# Patient Record
Sex: Male | Born: 1955 | Race: Black or African American | Hispanic: No | Marital: Single | Smoking: Never smoker
Health system: Southern US, Community
[De-identification: ages and names within clinical notes are randomized; demographics above are authoritative.]

## PROBLEM LIST (undated history)

## (undated) DIAGNOSIS — K219 Gastro-esophageal reflux disease without esophagitis: Secondary | ICD-10-CM

## (undated) DIAGNOSIS — I10 Essential (primary) hypertension: Secondary | ICD-10-CM

## (undated) DIAGNOSIS — Z923 Personal history of irradiation: Secondary | ICD-10-CM

## (undated) DIAGNOSIS — C801 Malignant (primary) neoplasm, unspecified: Secondary | ICD-10-CM

## (undated) DIAGNOSIS — E785 Hyperlipidemia, unspecified: Secondary | ICD-10-CM

## (undated) DIAGNOSIS — T7840XA Allergy, unspecified, initial encounter: Secondary | ICD-10-CM

## (undated) HISTORY — DX: Allergy, unspecified, initial encounter: T78.40XA

## (undated) HISTORY — PX: WISDOM TOOTH EXTRACTION: SHX21

## (undated) HISTORY — PX: OTHER SURGICAL HISTORY: SHX169

## (undated) HISTORY — DX: Gastro-esophageal reflux disease without esophagitis: K21.9

## (undated) HISTORY — DX: Essential (primary) hypertension: I10

## (undated) HISTORY — PX: APPENDECTOMY: SHX54

## (undated) HISTORY — PX: LEG SURGERY: SHX1003

## (undated) HISTORY — DX: Hyperlipidemia, unspecified: E78.5

---

## 2016-11-21 ENCOUNTER — Ambulatory Visit (INDEPENDENT_AMBULATORY_CARE_PROVIDER_SITE_OTHER): Payer: BLUE CROSS/BLUE SHIELD | Admitting: Family Medicine

## 2016-11-21 ENCOUNTER — Encounter: Payer: Self-pay | Admitting: Family Medicine

## 2016-11-21 VITALS — BP 138/76 | HR 62 | Temp 98.3°F | Ht 67.0 in | Wt 215.8 lb

## 2016-11-21 DIAGNOSIS — Z7689 Persons encountering health services in other specified circumstances: Secondary | ICD-10-CM

## 2016-11-21 DIAGNOSIS — J301 Allergic rhinitis due to pollen: Secondary | ICD-10-CM | POA: Diagnosis not present

## 2016-11-21 DIAGNOSIS — J309 Allergic rhinitis, unspecified: Secondary | ICD-10-CM | POA: Insufficient documentation

## 2016-11-21 DIAGNOSIS — I10 Essential (primary) hypertension: Secondary | ICD-10-CM

## 2016-11-21 MED ORDER — ATENOLOL 50 MG PO TABS: 50.0000 mg | ORAL_TABLET | Freq: Every day | ORAL | 1 refills | Status: DC

## 2016-11-21 MED ORDER — FLUTICASONE PROPIONATE 50 MCG/ACT NA SUSP: 2.0000 | Freq: Every day | NASAL | 6 refills | Status: DC

## 2016-11-21 MED ORDER — LISINOPRIL-HYDROCHLOROTHIAZIDE 20-25 MG PO TABS: 1.0000 | ORAL_TABLET | Freq: Every day | ORAL | 1 refills | Status: DC

## 2016-11-21 NOTE — Patient Instructions (Addendum)
It was a pleasure to see you today! Please follow up for a physical and lab work within the next 3 months or sooner if needed.    DASH Eating Plan DASH stands for "Dietary Approaches to Stop Hypertension." The DASH eating plan is a healthy eating plan that has been shown to reduce high blood pressure (hypertension). It may also reduce your risk for type 2 diabetes, heart disease, and stroke. The DASH eating plan may also help with weight loss. What are tips for following this plan? General guidelines   Avoid eating more than 2,300 mg (milligrams) of salt (sodium) a day. If you have hypertension, you may need to reduce your sodium intake to 1,500 mg a day.  Limit alcohol intake to no more than 1 drink a day for nonpregnant women and 2 drinks a day for men. One drink equals 12 oz of beer, 5 oz of wine, or 1 oz of hard liquor.  Work with your health care provider to maintain a healthy body weight or to lose weight. Ask what an ideal weight is for you.  Get at least 30 minutes of exercise that causes your heart to beat faster (aerobic exercise) most days of the week. Activities may include walking, swimming, or biking.  Work with your health care provider or diet and nutrition specialist (dietitian) to adjust your eating plan to your individual calorie needs. Reading food labels   Check food labels for the amount of sodium per serving. Choose foods with less than 5 percent of the Daily Value of sodium. Generally, foods with less than 300 mg of sodium per serving fit into this eating plan.  To find whole grains, look for the word "whole" as the first word in the ingredient list. Shopping   Buy products labeled as "low-sodium" or "no salt added."  Buy fresh foods. Avoid canned foods and premade or frozen meals. Cooking   Avoid adding salt when cooking. Use salt-free seasonings or herbs instead of table salt or sea salt. Check with your health care provider or pharmacist before using salt  substitutes.  Do not fry foods. Cook foods using healthy methods such as baking, boiling, grilling, and broiling instead.  Cook with heart-healthy oils, such as olive, canola, soybean, or sunflower oil. Meal planning    Eat a balanced diet that includes:  5 or more servings of fruits and vegetables each day. At each meal, try to fill half of your plate with fruits and vegetables.  Up to 6-8 servings of whole grains each day.  Less than 6 oz of lean meat, poultry, or fish each day. A 3-oz serving of meat is about the same size as a deck of cards. One egg equals 1 oz.  2 servings of low-fat dairy each day.  A serving of nuts, seeds, or beans 5 times each week.  Heart-healthy fats. Healthy fats called Omega-3 fatty acids are found in foods such as flaxseeds and coldwater fish, like sardines, salmon, and mackerel.  Limit how much you eat of the following:  Canned or prepackaged foods.  Food that is high in trans fat, such as fried foods.  Food that is high in saturated fat, such as fatty meat.  Sweets, desserts, sugary drinks, and other foods with added sugar.  Full-fat dairy products.  Do not salt foods before eating.  Try to eat at least 2 vegetarian meals each week.  Eat more home-cooked food and less restaurant, buffet, and fast food.  When eating at a  restaurant, ask that your food be prepared with less salt or no salt, if possible. What foods are recommended? The items listed may not be a complete list. Talk with your dietitian about what dietary choices are best for you. Grains  Whole-grain or whole-wheat bread. Whole-grain or whole-wheat pasta. Brown rice. [place] Morrow. Bulgur. Whole-grain and low-sodium cereals. Pita bread. Low-fat, low-sodium crackers. Whole-wheat flour tortillas. Vegetables  Fresh or frozen vegetables (raw, steamed, roasted, or grilled). Low-sodium or reduced-sodium tomato and vegetable juice. Low-sodium or reduced-sodium tomato sauce and  tomato paste. Low-sodium or reduced-sodium canned vegetables. Fruits  All fresh, dried, or frozen fruit. Canned fruit in natural juice (without added sugar). Meat and other protein foods  Skinless chicken or [place]. Ground chicken or [place]. Pork with fat trimmed off. Fish and seafood. Egg whites. Dried beans, peas, or lentils. Unsalted nuts, nut butters, and seeds. Unsalted canned beans. Lean cuts of beef with fat trimmed off. Low-sodium, lean deli meat. Dairy  Low-fat (1%) or fat-free (skim) milk. Fat-free, low-fat, or reduced-fat cheeses. Nonfat, low-sodium ricotta or cottage cheese. Low-fat or nonfat yogurt. Low-fat, low-sodium cheese. Fats and oils  Soft margarine without trans fats. Vegetable oil. Low-fat, reduced-fat, or light mayonnaise and salad dressings (reduced-sodium). Canola, safflower, olive, soybean, and sunflower oils. Avocado. Seasoning and other foods  Herbs. Spices. Seasoning mixes without salt. Unsalted popcorn and pretzels. Fat-free sweets. What foods are not recommended? The items listed may not be a complete list. Talk with your dietitian about what dietary choices are best for you. Grains  Baked goods made with fat, such as croissants, muffins, or some breads. Dry pasta or rice meal packs. Vegetables  Creamed or fried vegetables. Vegetables in a cheese sauce. Regular canned vegetables (not low-sodium or reduced-sodium). Regular canned tomato sauce and paste (not low-sodium or reduced-sodium). Regular tomato and vegetable juice (not low-sodium or reduced-sodium). Angie Fava. Olives. Fruits  Canned fruit in a light or heavy syrup. Fried fruit. Fruit in cream or butter sauce. Meat and other protein foods  Fatty cuts of meat. Ribs. Fried meat. Berniece Salines. Sausage. Bologna and other processed lunch meats. Salami. Fatback. Hotdogs. Bratwurst. Salted nuts and seeds. Canned beans with added salt. Canned or smoked fish. Whole eggs or egg yolks. Chicken or [place] with skin. Dairy  Whole  or 2% milk, cream, and half-and-half. Whole or full-fat cream cheese. Whole-fat or sweetened yogurt. Full-fat cheese. Nondairy creamers. Whipped toppings. Processed cheese and cheese spreads. Fats and oils  Butter. Stick margarine. Lard. Shortening. Ghee. Bacon fat. Tropical oils, such as coconut, palm kernel, or palm oil. Seasoning and other foods  Salted popcorn and pretzels. Onion salt, garlic salt, seasoned salt, table salt, and sea salt. Worcestershire sauce. Tartar sauce. Barbecue sauce. Teriyaki sauce. Soy sauce, including reduced-sodium. Steak sauce. Canned and packaged gravies. Fish sauce. Oyster sauce. Cocktail sauce. Horseradish that you find on the shelf. Ketchup. Mustard. Meat flavorings and tenderizers. Bouillon cubes. Hot sauce and Tabasco sauce. Premade or packaged marinades. Premade or packaged taco seasonings. Relishes. Regular salad dressings. Where to find more information:  National Heart, Lung, and [place]: https://wilson-eaton.com/  American Heart Association: www.heart.org Summary  The DASH eating plan is a healthy eating plan that has been shown to reduce high blood pressure (hypertension). It may also reduce your risk for type 2 diabetes, heart disease, and stroke.  With the DASH eating plan, you should limit salt (sodium) intake to 2,300 mg a day. If you have hypertension, you may need to reduce your sodium intake to 1,500  mg a day.  When on the DASH eating plan, aim to eat more fresh fruits and vegetables, whole grains, lean proteins, low-fat dairy, and heart-healthy fats.  Work with your health care provider or diet and nutrition specialist (dietitian) to adjust your eating plan to your individual calorie needs. This information is not intended to replace advice given to you by your health care provider. Make sure you discuss any questions you have with your health care provider. Document Released: 06/13/2011 Document Revised: 06/17/2016 Document Reviewed:  06/17/2016 Elsevier Interactive Patient Education  2017 [place] NOW OFFER   [facility] FAST TRACK!!!  SAME DAY Appointments for ACUTE CARE  Such as: Sprains, Injuries, cuts, abrasions, rashes, muscle pain, joint pain, back pain Colds, flu, sore throats, headache, allergies, cough, fever  Ear pain, sinus and eye infections Abdominal pain, nausea, vomiting, diarrhea, upset stomach Animal/insect bites  3 Easy Ways to Schedule: Walk-In Scheduling Call in scheduling Mychart Sign-up: [email or web address]

## 2016-11-21 NOTE — Progress Notes (Signed)
Patient ID: Matthew Hollyfield., male   DOB: 1956-01-20, 61 y.o.   MRN: [id number]  Patient presents to clinic today to establish care. He is a nonsmoker who reports feeling well overall.  Chronic Issues: HTN:  He monitors BP approximately every 3 days and reports systolic averages in the 427C and diastolic averages in the 62B. He denies chest pain, palpitations, SOB, numbness, tingling, weakness, headaches, or edema. He is adherent to his medication and denies adverse effects from medications. He follows a modified heart healthy diet and he exercises with walking 3 to 4 days a week approximately 30 minutes. He is taking atenolol and lisinopril/HCTZ. He does report adding salt to his diet "at times"   Allergic rhinitis:  Post nasal drip, rhinitis and nonproductive cough that are present with seasonal changes. He denies itchy/watery eyes, sinus pressure/pain, nasal congestion, sore throat, fever, chills, sweats, or ear pain Treatment at home with Zyrtec has provided moderate benefit   Health Maintenance: Dental --Needs to go; goes yearly [facility] --Needs to go; he plans to have this done Immunizations --Influenza vaccine 05/2016, Tdap needed Colonoscopy --Needs to have this done.     Past Medical History:  Diagnosis Date  . Hypertension     History reviewed. No pertinent surgical history.  No current outpatient prescriptions on file prior to visit.   No current facility-administered medications on file prior to visit.     Allergies  Allergen Reactions  . Pollen Extract     Family History  Problem Relation Age of Onset  . Arthritis Mother   . Hypertension Mother   . Diabetes Father   . Diabetes Brother     Social History   Social History  . Marital status: Single    Spouse name: N/A  . Number of children: N/A  . Years of education: N/A   Occupational History  . Not on file.   Social History Main Topics  . Smoking status: Never Smoker  . Smokeless tobacco: Never  Used  . Alcohol use Yes     Comment: Occassional  . Drug use: No  . Sexual activity: Not on file   Other Topics Concern  . Not on file   Social History Narrative  . No narrative on file    Review of Systems  Constitutional: Negative for chills and fever.  HENT: Negative for congestion, sinus pain and sore throat.        Post nasal drip  Respiratory: Positive for cough. Negative for sputum production, shortness of breath and wheezing.   Cardiovascular: Negative for chest pain and palpitations.  Gastrointestinal: Negative for abdominal pain, heartburn, melena, nausea and vomiting.  Genitourinary: Negative for dysuria, frequency and urgency.  Musculoskeletal: Negative for myalgias.  Skin: Negative for rash.  Neurological: Negative for dizziness, tingling and tremors.  Psychiatric/Behavioral:       Depressed or anxious mood    BP 138/76 (BP Location: Left Arm, Patient Position: Sitting, Cuff Size: Normal)   Pulse 62   Temp 98.3 F (36.8 C) (Oral)   Ht 5\' 7"  (1.702 m)   Wt 215 lb 12.8 oz (97.9 kg)   SpO2 98%   BMI 33.80 kg/m   Physical Exam  Assessment/Plan: 1. Hypertension, essential Controlled; provided refills of medications and reviewed DASH diet recommendations. - lisinopril-hydrochlorothiazide (PRINZIDE,ZESTORETIC) 20-25 MG tablet; Take 1 tablet by mouth daily.  Dispense: 100 tablet; Refill: 1 - atenolol (TENORMIN) 50 MG tablet; Take 1 tablet (50 mg total) by mouth daily.  Dispense: 100 tablet;  Refill: 1  2. Seasonal allergic rhinitis due to pollen Zyrtec with addition of flonase for symptoms. - fluticasone (FLONASE) 50 MCG/ACT nasal spray; Place 2 sprays into both nostrils daily.  Dispense: 16 g; Refill: 6  3. Encounter to establish care We reviewed the PMH, PSH, FH, SH, Meds and Allergies. -We provided refills for any medications we will prescribe as needed. -We addressed current concerns per orders and patient instructions. -We have asked for records for  pertinent exams, studies, vaccines and notes from previous providers. -We have advised patient to follow up per instructions below.   -Patient advised to return or notify a provider immediately if symptoms worsen or persist or new concerns arise.  Patient is overdue for a physical exam and lab work. He will schedule a physical within the next 3 months. He will consider health maintenance screenings such as colonoscopy at that time.  Delano Metz, FNP-C

## 2017-02-11 DIAGNOSIS — J329 Chronic sinusitis, unspecified: Secondary | ICD-10-CM | POA: Diagnosis not present

## 2017-02-11 DIAGNOSIS — R5383 Other fatigue: Secondary | ICD-10-CM | POA: Diagnosis not present

## 2017-02-11 DIAGNOSIS — R6883 Chills (without fever): Secondary | ICD-10-CM | POA: Diagnosis not present

## 2017-05-27 ENCOUNTER — Ambulatory Visit (INDEPENDENT_AMBULATORY_CARE_PROVIDER_SITE_OTHER): Payer: BLUE CROSS/BLUE SHIELD | Admitting: Adult Health

## 2017-05-27 ENCOUNTER — Encounter: Payer: Self-pay | Admitting: Adult Health

## 2017-05-27 VITALS — BP 136/76 | Temp 97.8°F | Ht 68.0 in | Wt 217.0 lb

## 2017-05-27 DIAGNOSIS — Z1211 Encounter for screening for malignant neoplasm of colon: Secondary | ICD-10-CM | POA: Diagnosis not present

## 2017-05-27 DIAGNOSIS — K2100 Gastro-esophageal reflux disease with esophagitis, without bleeding: Secondary | ICD-10-CM | POA: Insufficient documentation

## 2017-05-27 DIAGNOSIS — Z23 Encounter for immunization: Secondary | ICD-10-CM

## 2017-05-27 DIAGNOSIS — Z7689 Persons encountering health services in other specified circumstances: Secondary | ICD-10-CM | POA: Diagnosis not present

## 2017-05-27 DIAGNOSIS — I10 Essential (primary) hypertension: Secondary | ICD-10-CM

## 2017-05-27 MED ORDER — LISINOPRIL-HYDROCHLOROTHIAZIDE 20-25 MG PO TABS: 1.0000 | ORAL_TABLET | Freq: Every day | ORAL | 0 refills | Status: DC

## 2017-05-27 MED ORDER — PANTOPRAZOLE SODIUM 40 MG PO TBEC: 40.0000 mg | DELAYED_RELEASE_TABLET | Freq: Every day | ORAL | 0 refills | Status: DC

## 2017-05-27 MED ORDER — ATENOLOL 50 MG PO TABS: 50.0000 mg | ORAL_TABLET | Freq: Every day | ORAL | 0 refills | Status: DC

## 2017-05-27 NOTE — Progress Notes (Signed)
Patient presents to clinic today to establish care. He is a pleasant 61 year old male who  has a past medical history of Hypertension.  Unsure of when last physical was " many years ago"   Acute Concerns: Establish Care    Chronic Issues: Essential Hypertension - He takes Atenolol 50 mg and lisinopril 20-25 mg. He monitors his blood pressure about every three days and reports his blood pressure readings are usually in the 130's/70-80's.   GERD - chronic issue. He has used Omeprazole and Zantac in the past. Has cut out offending foods but continues to have episodes of heart burn. He is waking up in the morning with a sour taste in his mouth. Denies any radiating chest pain or SOB   Health Maintenance: Dental -- Yearly visits  Vision -- Needs to go.  Immunizations -- Needs Tdap  Colonoscopy -- Never had one Diet: Tries to eat healthy but adds salt to his diet Exercise: He walks 3 -4 days per week for about 30 minutes per time.    Past Medical History:  Diagnosis Date  . Hypertension     Past Surgical History:  Procedure Laterality Date  . APPENDECTOMY     2nd grade    Current Outpatient Medications on File Prior to Visit  Medication Sig Dispense Refill  . atenolol (TENORMIN) 50 MG tablet Take 1 tablet (50 mg total) by mouth daily. 100 tablet 1  . fluticasone (FLONASE) 50 MCG/ACT nasal spray Place 2 sprays into both nostrils daily. 16 g 6  . lisinopril-hydrochlorothiazide (PRINZIDE,ZESTORETIC) 20-25 MG tablet Take 1 tablet by mouth daily. 100 tablet 1   No current facility-administered medications on file prior to visit.     Allergies  Allergen Reactions  . Pollen Extract     Family History  Problem Relation Age of Onset  . Arthritis Mother   . Hypertension Mother   . Diabetes Father   . Diabetes Brother     Social History   Socioeconomic History  . Marital status: Single    Spouse name: Not on file  . Number of children: Not on file  . Years of  education: Not on file  . Highest education level: Not on file  Social Needs  . Financial resource strain: Not on file  . Food insecurity - worry: Not on file  . Food insecurity - inability: Not on file  . Transportation needs - medical: Not on file  . Transportation needs - non-medical: Not on file  Occupational History  . Not on file  Tobacco Use  . Smoking status: Never Smoker  . Smokeless tobacco: Never Used  Substance and Sexual Activity  . Alcohol use: Yes    Alcohol/week: 2.4 oz    Types: 4 Glasses of wine per week    Comment: Occassional  . Drug use: No  . Sexual activity: No  Other Topics Concern  . Not on file  Social History Narrative  . Not on file    Review of Systems  Constitutional: Negative.   HENT: Negative.   Eyes: Negative.   Cardiovascular: Negative.   Gastrointestinal: Positive for heartburn.  Skin: Negative.   All other systems reviewed and are negative.   BP 136/76 (BP Location: Left Arm)   Temp 97.8 F (36.6 C) (Oral)   Ht 5\' 8"  (1.727 m)   Wt 217 lb (98.4 kg)   BMI 32.99 kg/m   Physical Exam  Constitutional: He is oriented to person, place, and time  and well-developed, well-nourished, and in no distress. No distress.  Obese    Cardiovascular: Normal rate, regular rhythm, normal heart sounds and intact distal pulses. Exam reveals no gallop and no friction rub.  No murmur heard. Pulmonary/Chest: Effort normal and breath sounds normal. No respiratory distress. He has no wheezes. He has no rales.  Abdominal: Soft. Bowel sounds are normal. He exhibits no distension and no mass. There is no tenderness. There is no rebound and no guarding.  Musculoskeletal: Normal range of motion. He exhibits no edema, tenderness or deformity.  Neurological: He is alert and oriented to person, place, and time. Gait normal. GCS score is 15.  Skin: Skin is warm and dry. No rash noted. No erythema. No pallor.  Psychiatric: Mood, memory, affect and judgment normal.   Nursing note and vitals reviewed.   Assessment/Plan: 1. Encounter to establish care - Follow up for CPE or sooner if needed - Continue to exercise and eat healthy. Cut back on sodium   2. Hypertension, essential - Well controlled. No change in medication  - atenolol (TENORMIN) 50 MG tablet; Take 1 tablet (50 mg total) by mouth daily.  Dispense: 100 tablet; Refill: 0 - lisinopril-hydrochlorothiazide (PRINZIDE,ZESTORETIC) 20-25 MG tablet; Take 1 tablet by mouth daily.  Dispense: 100 tablet; Refill: 0  3. Colon cancer screening - Ambulatory referral to Gastroenterology  4. Gastroesophageal reflux disease with esophagitis  - pantoprazole (PROTONIX) 40 MG tablet; Take 1 tablet (40 mg total) by mouth daily.  Dispense: 90 tablet; Refill: 0  5. Need for Tdap vaccination - Tdap vaccine greater than or equal to 7yo IM  [name], NP

## 2017-05-27 NOTE — Patient Instructions (Signed)
It was great meeting you today   Please follow up with me for your physical.   I have sent in a prescription for Protonix - to help with your acid reflux   Someone will call you to schedule your colonoscopy

## 2017-06-26 ENCOUNTER — Encounter: Payer: Self-pay | Admitting: Adult Health

## 2017-07-09 ENCOUNTER — Encounter: Payer: Self-pay | Admitting: Adult Health

## 2017-07-09 ENCOUNTER — Ambulatory Visit (INDEPENDENT_AMBULATORY_CARE_PROVIDER_SITE_OTHER): Payer: BLUE CROSS/BLUE SHIELD | Admitting: Adult Health

## 2017-07-09 VITALS — BP 120/70 | Temp 97.7°F | Wt 209.0 lb

## 2017-07-09 DIAGNOSIS — B349 Viral infection, unspecified: Secondary | ICD-10-CM | POA: Diagnosis not present

## 2017-07-09 NOTE — Progress Notes (Signed)
Subjective:    Patient ID: Matthew Meyer., male    DOB: 06-Apr-1956, 62 y.o.   MRN: [id number]  HPI 62 year old male who  has a past medical history of GERD (gastroesophageal reflux disease) and Hypertension. He presents to the office today for an acute complaint of fevers, headache, cough, chills and diarrhea with abdominal cramping.  His symptoms started six days ago. Today he reports " I am feeling a lot better today". He had a formed bowel movement this morning and no longer is having fevers or chills. His cough has resolved and has not had a headache in 2 days.    He denies any vomiting or nausea.    Review of Systems See HPI  Past Medical History:  Diagnosis Date  . GERD (gastroesophageal reflux disease)   . Hypertension     Social History   Socioeconomic History  . Marital status: Single    Spouse name: Not on file  . Number of children: Not on file  . Years of education: Not on file  . Highest education level: Not on file  Social Needs  . Financial resource strain: Not on file  . Food insecurity - worry: Not on file  . Food insecurity - inability: Not on file  . Transportation needs - medical: Not on file  . Transportation needs - non-medical: Not on file  Occupational History  . Not on file  Tobacco Use  . Smoking status: Never Smoker  . Smokeless tobacco: Never Used  Substance and Sexual Activity  . Alcohol use: Yes    Alcohol/week: 2.4 oz    Types: 4 Glasses of wine per week    Comment: Occassional  . Drug use: No  . Sexual activity: No  Other Topics Concern  . Not on file  Social History Narrative  . Not on file    Past Surgical History:  Procedure Laterality Date  . APPENDECTOMY     2nd grade    Family History  Problem Relation Age of Onset  . Arthritis Mother   . Hypertension Mother   . Diabetes Father   . Diabetes Brother     Allergies  Allergen Reactions  . Pollen Extract     Current Outpatient Medications on File Prior  to Visit  Medication Sig Dispense Refill  . atenolol (TENORMIN) 50 MG tablet Take 1 tablet (50 mg total) by mouth daily. 100 tablet 0  . lisinopril-hydrochlorothiazide (PRINZIDE,ZESTORETIC) 20-25 MG tablet Take 1 tablet by mouth daily. 100 tablet 0  . pantoprazole (PROTONIX) 40 MG tablet Take 1 tablet (40 mg total) by mouth daily. 90 tablet 0   No current facility-administered medications on file prior to visit.     BP 120/70 (BP Location: Left Arm)   Temp 97.7 F (36.5 C) (Oral)   Wt 209 lb (94.8 kg)   BMI 31.78 kg/m       Objective:   Physical Exam  Constitutional: He is oriented to person, place, and time. He appears well-developed and well-nourished. No distress.  Eyes: Conjunctivae and EOM are normal. Pupils are equal, round, and reactive to light. Right eye exhibits no discharge. Left eye exhibits no discharge. No scleral icterus.  Neck: Normal range of motion. Neck supple. No thyromegaly present.  Cardiovascular: Normal rate, regular rhythm, normal heart sounds and intact distal pulses. Exam reveals no gallop and no friction rub.  No murmur heard. Pulmonary/Chest: Effort normal and breath sounds normal. No respiratory distress. He has no wheezes.  He has no rales. He exhibits no tenderness.  Abdominal: Soft. Bowel sounds are normal. He exhibits no distension and no mass. There is no tenderness. There is no rebound and no guarding.  Musculoskeletal: Normal range of motion. He exhibits no edema, tenderness or deformity.  Lymphadenopathy:    He has no cervical adenopathy.  Neurological: He is alert and oriented to person, place, and time. He has normal reflexes.  Skin: Skin is warm and dry. No rash noted. He is not diaphoretic. No erythema. No pallor.  Psychiatric: He has a normal mood and affect. His behavior is normal. Judgment and thought content normal.  Nursing note and vitals reviewed.     Assessment & Plan:  1. Viral syndrome - Has resolved  - Advised to continue with  liquid diet for the next 24 hours  - Follow up if symptoms return   [name], NP

## 2017-09-16 ENCOUNTER — Other Ambulatory Visit: Payer: Self-pay | Admitting: Adult Health

## 2017-09-16 DIAGNOSIS — K2100 Gastro-esophageal reflux disease with esophagitis, without bleeding: Secondary | ICD-10-CM

## 2017-09-16 DIAGNOSIS — I10 Essential (primary) hypertension: Secondary | ICD-10-CM

## 2017-09-18 NOTE — Telephone Encounter (Signed)
Left a message for a return call.  Pt due for cpx and fasting lab work.  CRM created.

## 2017-09-19 ENCOUNTER — Telehealth: Payer: Self-pay | Admitting: Adult Health

## 2017-09-19 NOTE — Telephone Encounter (Signed)
Copied from [place] [id number]. Topic: Quick Communication - Office Called Patient >> Sep 18, 2017  9:51 AM Matthew Meyer, CMA wrote: Reason for CRM:   Pt now due for CPX and FASTING lab work.  Please schedule the pt when he returns call.  >> Sep 19, 2017  8:43 AM Matthew Meyer wrote: Pt called back stating that he will call back to make an appointment once he get a refill on his medicines stating that he will run out by Sunday I already put in a telephone call about med refills

## 2017-09-19 NOTE — Telephone Encounter (Signed)
Copied from [place] [id number]. Topic: Quick Communication - Rx Refill/Question >> Sep 19, 2017  8:39 AM Matthew Meyer wrote: Medication: atenolol (TENORMIN) 50 MG tablet    lisinopril-hydrochlorothiazide (PRINZIDE,ZESTORETIC) 20-25 MG tablet   pantoprazole (PROTONIX) 40 MG tablet      Has the patient contacted their pharmacy? Yes.    Patient called pharmacy on Tuesday   (Agent: If no, request that the patient contact the pharmacy for the refill.)   Preferred Pharmacy (with phone number or street name):   [facility] [address], [place] [phone] (Phone) [phone] (Fax)     Agent: Please be advised that RX refills may take up to 3 business days. We ask that you follow-up with your pharmacy.

## 2017-09-19 NOTE — Telephone Encounter (Signed)
Medications filled in another encounter on 09/19/17.

## 2017-09-19 NOTE — Telephone Encounter (Signed)
Yvette Rack 09/19/2017 08:43 AM  Summary: pt called backabout scheduling and med refill    Pt called back stating that he will call back to make an appointment once he get a refill on his medicines stating that he will run out by Sunday I already put in a telephone call about med refills

## 2017-09-19 NOTE — Telephone Encounter (Signed)
30 day supply sent to the pharmacy.  

## 2017-10-02 DIAGNOSIS — K119 Disease of salivary gland, unspecified: Secondary | ICD-10-CM | POA: Diagnosis not present

## 2017-10-03 ENCOUNTER — Other Ambulatory Visit: Payer: Self-pay | Admitting: Otolaryngology

## 2017-10-03 DIAGNOSIS — K118 Other diseases of salivary glands: Secondary | ICD-10-CM

## 2017-10-16 ENCOUNTER — Ambulatory Visit
Admission: RE | Admit: 2017-10-16 | Discharge: 2017-10-16 | Disposition: A | Discharge status: Home / Self Care | Payer: BLUE CROSS/BLUE SHIELD | Source: Ambulatory Visit | Attending: Otolaryngology | Admitting: Otolaryngology

## 2017-10-16 DIAGNOSIS — R221 Localized swelling, mass and lump, neck: Secondary | ICD-10-CM | POA: Diagnosis not present

## 2017-10-16 DIAGNOSIS — K118 Other diseases of salivary glands: Secondary | ICD-10-CM

## 2017-10-16 MED ORDER — IOPAMIDOL (ISOVUE-300) INJECTION 61%
75.0000 mL | Freq: Once | INTRAVENOUS | Status: AC | PRN
  Administered 2017-10-16: 75 mL via INTRAVENOUS

## 2017-10-21 ENCOUNTER — Encounter: Payer: Self-pay | Admitting: Adult Health

## 2017-10-21 ENCOUNTER — Ambulatory Visit (INDEPENDENT_AMBULATORY_CARE_PROVIDER_SITE_OTHER): Payer: BLUE CROSS/BLUE SHIELD | Admitting: Adult Health

## 2017-10-21 VITALS — BP 116/70 | Temp 98.7°F | Wt 209.0 lb

## 2017-10-21 DIAGNOSIS — D490 Neoplasm of unspecified behavior of digestive system: Secondary | ICD-10-CM | POA: Diagnosis not present

## 2017-10-21 DIAGNOSIS — K2100 Gastro-esophageal reflux disease with esophagitis, without bleeding: Secondary | ICD-10-CM

## 2017-10-21 DIAGNOSIS — I10 Essential (primary) hypertension: Secondary | ICD-10-CM

## 2017-10-21 MED ORDER — LISINOPRIL-HYDROCHLOROTHIAZIDE 20-25 MG PO TABS: 1.0000 | ORAL_TABLET | Freq: Every day | ORAL | 3 refills | Status: DC

## 2017-10-21 MED ORDER — ATENOLOL 50 MG PO TABS: 50.0000 mg | ORAL_TABLET | Freq: Every day | ORAL | 3 refills | Status: DC

## 2017-10-21 MED ORDER — PANTOPRAZOLE SODIUM 40 MG PO TBEC: 40.0000 mg | DELAYED_RELEASE_TABLET | Freq: Every day | ORAL | 3 refills | Status: DC

## 2017-10-21 NOTE — Progress Notes (Signed)
Subjective:    Patient ID: Matthew Meyer., male    DOB: 1956-02-12, 62 y.o.   MRN: [id number]  HPI  62 year old male who  has a past medical history of GERD (gastroesophageal reflux disease) and Hypertension.  He presents to the office today for concern of growth on the left side of his face. He has been seen by ENT, Dr Constance Holster who obtained an CT scan of the face.   IMPRESSION: 1. 6.6 x 3.8 x 5.5 cm heterogeneously enhancing mass centered on the accessory parotid tissue lateral to the masseter muscle. 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 Alternatively, this could represent metastatic disease. 2. Tumor extends into the left parotid duct anteriorly. 3. Enlarged left level 2 lymph node is concerning for metastatic disease. 4. Asymmetry of soft tissue at the left palatine tonsil and glossal tonsillar also cusp. Please correlate with direct examination and possible biopsy.  Patient reports that he has not been able to get in touch with ENT yet, as they call when he is at work.   He denies any pain at this time. He will feel some " twitching" at night but takes Tylenol and this relieves any discomfort. Denies any trouble with swallowing, breathing, or vision.   Patient also needs medications refill.   Review of Systems See HPI   Past Medical History:  Diagnosis Date  . GERD (gastroesophageal reflux disease)   . Hypertension     Social History   Socioeconomic History  . Marital status: Single    Spouse name: Not on file  . Number of children: Not on file  . Years of education: Not on file  . Highest education level: Not on file  Occupational History  . Not on file  Social Needs  . Financial resource strain: Not on file  . Food insecurity:    Worry: Not on file    Inability: Not on file  . Transportation needs:    Medical: Not on file    Non-medical: Not on file  Tobacco Use  . Smoking status: Never Smoker  . Smokeless tobacco: Never Used   Substance and Sexual Activity  . Alcohol use: Yes    Alcohol/week: 2.4 oz    Types: 4 Glasses of wine per week    Comment: Occassional  . Drug use: No  . Sexual activity: Never  Lifestyle  . Physical activity:    Days per week: Not on file    Minutes per session: Not on file  . Stress: Not on file  Relationships  . Social connections:    Talks on phone: Not on file    Gets together: Not on file    Attends religious service: Not on file    Active member of club or organization: Not on file    Attends meetings of clubs or organizations: Not on file    Relationship status: Not on file  . Intimate partner violence:    Fear of current or ex partner: Not on file    Emotionally abused: Not on file    Physically abused: Not on file    Forced sexual activity: Not on file  Other Topics Concern  . Not on file  Social History Narrative  . Not on file    Past Surgical History:  Procedure Laterality Date  . APPENDECTOMY     2nd grade    Family History  Problem Relation Age of Onset  . Arthritis Mother   . Hypertension  Mother   . Diabetes Father   . Diabetes Brother     Allergies  Allergen Reactions  . Pollen Extract     Current Outpatient Medications on File Prior to Visit  Medication Sig Dispense Refill  . atenolol (TENORMIN) 50 MG tablet take 1 tablet by mouth daily 30 tablet 0  . lisinopril-hydrochlorothiazide (PRINZIDE,ZESTORETIC) 20-25 MG tablet TAKE ONE TABLET BY MOUTH ONE TIME DAILY  30 tablet 0  . pantoprazole (PROTONIX) 40 MG tablet TAKE ONE TABLET BY MOUTH ONE TIME DAILY  30 tablet 0   No current facility-administered medications on file prior to visit.     BP 116/70   Temp 98.7 F (37.1 C) (Oral)   Wt 209 lb (94.8 kg)   BMI 31.78 kg/m       Objective:   Physical Exam  Constitutional: He is oriented to person, place, and time. He appears well-developed and well-nourished.  HENT:  Head: Normocephalic and atraumatic.  Right Ear: External ear  normal.  Left Ear: External ear normal.  Nose: Nose normal.  Mouth/Throat: Oropharynx is clear and moist.  6 cm nonfluctuant left parotid mass.  Eyes: Pupils are equal, round, and reactive to light. Conjunctivae and EOM are normal. Right eye exhibits no discharge. Left eye exhibits no discharge. No scleral icterus.  Neck: Normal range of motion. Neck supple.  Cardiovascular: Normal rate, normal heart sounds and intact distal pulses. Exam reveals no gallop and no friction rub.  No murmur heard. Pulmonary/Chest: Effort normal. No respiratory distress. He has no wheezes. He has no rales. He exhibits no tenderness.  Neurological: He is alert and oriented to person, place, and time.  Skin: Skin is warm and dry. No rash noted. He is not diaphoretic. No erythema. No pallor.  Psychiatric: He has a normal mood and affect. His behavior is normal. Thought content normal.  Nursing note and vitals reviewed.     Assessment & Plan:  1. Parotid tumor  - Ambulatory referral to Surgical Oncology  2. Hypertension, essential  - atenolol (TENORMIN) 50 MG tablet; Take 1 tablet (50 mg total) by mouth daily.  Dispense: 90 tablet; Refill: 3 - lisinopril-hydrochlorothiazide (PRINZIDE,ZESTORETIC) 20-25 MG tablet; Take 1 tablet by mouth daily.  Dispense: 90 tablet; Refill: 3  3. Gastroesophageal reflux disease with esophagitis  - pantoprazole (PROTONIX) 40 MG tablet; Take 1 tablet (40 mg total) by mouth daily.  Dispense: 90 tablet; Refill: 3  [name], NP

## 2017-10-22 ENCOUNTER — Telehealth: Payer: Self-pay | Admitting: Family Medicine

## 2017-10-22 NOTE — Telephone Encounter (Signed)
Left a message for a return call.

## 2017-10-22 NOTE — Telephone Encounter (Signed)
Spoke to the pt and informed him that [place] spoke to Dr. Constance Holster.  Referral to oncology cancelled and Dr. Constance Holster will perform surgery.  Instructed to call Dr. Janeice Robinson office ASAP.  Pt agreed.  No further action required.

## 2017-10-22 NOTE — Telephone Encounter (Signed)
-----   Message from [name], NP sent at 10/22/2017  8:47 AM EDT ----- Please call [name] and tell him I spoke with Dr. Constance Holster. He wants to hold off on Oncology referral at this time. Dr. Constance Holster will keep trying to get a hold of him, but have [name] try and contact him as well.   Ok to d/c oncology referral  ----- Message ----- From: [name], MD Sent: 10/22/2017   8:24 AM To: [name], NP  Actually you can hold off on Onc referral until we have a Dx, He will need this removed and then if it is cancer he will need Radiation most likely.  I'll keep trying to reach him. Thanks     ----- Message ----- From: [name], NP Sent: 10/21/2017   3:56 PM To: [name], MD   Dr. Constance Holster,   I am writing on behalf of a mutual patient. First, I want to thank you for seeing Matthew Meyer.   He was seen in our office today for the parotid mass on the left side of his face. He told me that he has missed some calls from you because he was at work when you called. During toda's visit, I reviewed his CT scan with him because he wanted to know what was going on. Due to the nature of the CT, I went ahead and referred him to Oncology so that they could start their work up.   I just wanted to touch base with you and let you know. Please let me know if there is anything else I can do to help   [employer]

## 2017-10-23 DIAGNOSIS — C07 Malignant neoplasm of parotid gland: Secondary | ICD-10-CM | POA: Diagnosis not present

## 2017-10-29 ENCOUNTER — Telehealth: Payer: Self-pay | Admitting: Hematology and Oncology

## 2017-10-29 ENCOUNTER — Other Ambulatory Visit: Payer: Self-pay | Admitting: *Deleted

## 2017-10-29 ENCOUNTER — Telehealth: Payer: Self-pay | Admitting: *Deleted

## 2017-10-29 ENCOUNTER — Encounter: Payer: Self-pay | Admitting: Hematology and Oncology

## 2017-10-29 DIAGNOSIS — C07 Malignant neoplasm of parotid gland: Secondary | ICD-10-CM

## 2017-10-29 NOTE — Progress Notes (Signed)
Head and Neck Cancer Location of Tumor / Histology:  10/23/17 Final Cytologic Interpretation  Left parotid mass, Fine Needle Aspiration I (smears and cell block): Malignant cells present, consistent with carcinoma.  Patient presented months ago with symptoms of: Matthew Meyer developed left facial swelling the beginning of March.   Biopsies of left parotid mass revealed: malignant cells consistent with carcinoma.   Nutrition Status Yes No Comments  Weight changes? []  [x]    Swallowing concerns? []  [x]    PEG? []  [x]     Referrals Yes No Comments  Social Work? []  [x]    Dentistry? []  [x]    Swallowing therapy? []  [x]    Nutrition? []  [x]    Med/Onc? [x]  []  Matthew Meyer 11/04/17   Safety Issues Yes No Comments  Prior radiation? []  [x]    Pacemaker/ICD? []  [x]    Possible current pregnancy? []  [x]    Is the patient on methotrexate? []  [x]     Tobacco/Marijuana/Snuff/ETOH use: Matthew Meyer has never smoked or used smokeless tobacco. Matthew Meyer is not currently drinking alcohol due to acid reflux. Matthew Meyer tells me Matthew Meyer will drink on Sunday's watching sports.   Past/Anticipated interventions by otolaryngology, if any:  10/28/17 Matthew Meyer telephone note Left message for him to call back about biopsy results. 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 Matthew Meyer is either going to need extensive parotid and neck surgery or possibly chemotherapy plus minus radiation and then surgery. I am going to talk with oncology.   10/23/17 Matthew Meyer Procedure: 2% xylocaine with epinephrine was infiltrated into the overlying skin. First pass was made with a 25 gauge needle and 10 cc syringe. Second pass was made with a 22 gauge needle. Specimen was placed on microscopic slides and air-dried. Additional material was placed in Cytolyte solution for cell block preparation. Only 2 needles were used. A bandage was applied.  Matthew Meyer tolerated the procedure well. Results will be discussed when available. Electronically signed by: Matthew Salts,  MD 10/23/17 1109   Past/Anticipated interventions by medical oncology, if any:  Matthew Meyer 11/04/17   Current Complaints / other details:  10/16/17 CT Neck  IMPRESSION: 1. 6.6 x 3.8 x 5.5 cm heterogeneously enhancing mass centered on the accessory parotid tissue lateral to the masseter muscle. 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 Alternatively, this could represent metastatic disease. 2. Tumor extends into the left parotid duct anteriorly. 3. Enlarged left level 2 lymph node is concerning for metastatic disease. 4. Asymmetry of soft tissue at the left palatine tonsil and glossal tonsillar also cusp. Please correlate with direct examination and possible biopsy  BP (!) 144/76   Pulse 64   Temp 98.6 F (37 C)   Resp 18   Ht 5\' 8"  (1.727 m)   Wt 207 lb 9.6 oz (94.2 kg)   SpO2 100% Comment: room air  BMI 31.57 kg/m    Wt Readings from Last 3 Encounters:  10/31/17 207 lb 9.6 oz (94.2 kg)  10/21/17 209 lb (94.8 kg)  07/09/17 209 lb (94.8 kg)

## 2017-10-29 NOTE — Progress Notes (Unsigned)
ambulatory

## 2017-10-29 NOTE — Telephone Encounter (Signed)
Appt has been scheduled for the pt to see Dr. Lebron Conners on 4/30 at 8am. Pt has agreed to the appt date and time.  Letter mailed.

## 2017-10-29 NOTE — Telephone Encounter (Signed)
Oncology Nurse Navigator Documentation  Placed introductory call to new referral patient Matthew Meyer.  Introduced myself as the H&N oncology nurse navigator that works with Drs. Ginny Forth to whom he has been referred by Dr. Constance Holster.  He confirmed understanding of referrals.  He voiced understanding I will call him with appts.  He stated he starts work at 3:00PM, needs morning appts.  I later called him, LVMM, indicating he has appt with Dr. Isidore Moos this Friday 07:30.    Gayleen Orem, RN, BSN Head & Neck Oncology Nurse [place] [phone]

## 2017-10-31 ENCOUNTER — Other Ambulatory Visit: Payer: Self-pay | Admitting: Otolaryngology

## 2017-10-31 ENCOUNTER — Other Ambulatory Visit: Payer: Self-pay

## 2017-10-31 ENCOUNTER — Ambulatory Visit
Admission: RE | Admit: 2017-10-31 | Discharge: 2017-10-31 | Disposition: A | Discharge status: Home / Self Care | Payer: BLUE CROSS/BLUE SHIELD | Source: Ambulatory Visit | Attending: Radiation Oncology | Admitting: Radiation Oncology

## 2017-10-31 ENCOUNTER — Telehealth: Payer: Self-pay | Admitting: Nutrition

## 2017-10-31 ENCOUNTER — Encounter: Payer: Self-pay | Admitting: Radiation Oncology

## 2017-10-31 VITALS — BP 144/76 | HR 64 | Temp 98.6°F | Resp 18 | Ht 68.0 in | Wt 207.6 lb

## 2017-10-31 DIAGNOSIS — I10 Essential (primary) hypertension: Secondary | ICD-10-CM | POA: Insufficient documentation

## 2017-10-31 DIAGNOSIS — Z923 Personal history of irradiation: Secondary | ICD-10-CM | POA: Insufficient documentation

## 2017-10-31 DIAGNOSIS — Z79899 Other long term (current) drug therapy: Secondary | ICD-10-CM | POA: Insufficient documentation

## 2017-10-31 DIAGNOSIS — K219 Gastro-esophageal reflux disease without esophagitis: Secondary | ICD-10-CM | POA: Diagnosis not present

## 2017-10-31 DIAGNOSIS — D490 Neoplasm of unspecified behavior of digestive system: Secondary | ICD-10-CM

## 2017-10-31 DIAGNOSIS — R599 Enlarged lymph nodes, unspecified: Secondary | ICD-10-CM | POA: Insufficient documentation

## 2017-10-31 DIAGNOSIS — C07 Malignant neoplasm of parotid gland: Secondary | ICD-10-CM | POA: Diagnosis not present

## 2017-10-31 LAB — CBC WITH DIFFERENTIAL (CANCER CENTER ONLY)
BASOS ABS: 0.1 10*3/uL (ref 0.0–0.1)
BASOS PCT: 1 %
Eosinophils Absolute: 0.2 10*3/uL (ref 0.0–0.5)
Eosinophils Relative: 4 %
HCT: 35.3 % — ABNORMAL LOW (ref 38.4–49.9)
HEMOGLOBIN: 11.8 g/dL — AB (ref 13.0–17.1)
Lymphocytes Relative: 23 %
Lymphs Abs: 1.4 10*3/uL (ref 0.9–3.3)
MCH: 29 pg (ref 27.2–33.4)
MCHC: 33.4 g/dL (ref 32.0–36.0)
MCV: 86.8 fL (ref 79.3–98.0)
Monocytes Absolute: 0.4 10*3/uL (ref 0.1–0.9)
Monocytes Relative: 7 %
NEUTROS PCT: 65 %
Neutro Abs: 4.1 10*3/uL (ref 1.5–6.5)
Platelet Count: 247 10*3/uL (ref 140–400)
RBC: 4.07 MIL/uL — ABNORMAL LOW (ref 4.20–5.82)
RDW: 13.2 % (ref 11.0–14.6)
WBC Count: 6.2 10*3/uL (ref 4.0–10.3)

## 2017-10-31 LAB — CMP (CANCER CENTER ONLY)
ALT: 16 U/L (ref 0–55)
AST: 20 U/L (ref 5–34)
Albumin: 3.9 g/dL (ref 3.5–5.0)
Alkaline Phosphatase: 66 U/L (ref 40–150)
Anion gap: 9 (ref 3–11)
BILIRUBIN TOTAL: 0.5 mg/dL (ref 0.2–1.2)
BUN: 15 mg/dL (ref 7–26)
CO2: 28 mmol/L (ref 22–29)
CREATININE: 1.15 mg/dL (ref 0.70–1.30)
Calcium: 10.8 mg/dL — ABNORMAL HIGH (ref 8.4–10.4)
Chloride: 102 mmol/L (ref 98–109)
GFR, Est AFR Am: >60 mL/min (ref >60)
GFR, Estimated: >60 mL/min (ref >60)
Glucose, Bld: 116 mg/dL (ref 70–140)
Potassium: 4.1 mmol/L (ref 3.5–5.1)
Sodium: 139 mmol/L (ref 136–145)
TOTAL PROTEIN: 8.4 g/dL — AB (ref 6.4–8.3)

## 2017-10-31 LAB — MAGNESIUM: Magnesium: 2.1 mg/dL (ref 1.7–2.4)

## 2017-10-31 LAB — TSH: TSH: 1.491 u[IU]/mL (ref 0.320–4.118)

## 2017-10-31 LAB — PHOSPHORUS: Phosphorus: 4.5 mg/dL (ref 2.5–4.6)

## 2017-10-31 NOTE — Progress Notes (Signed)
Radiation Oncology         [phone]) [phone] ________________________________  Initial outpatient Consultation  Name: Matthew Meyer. MRN: [id number]  Date: 10/31/2017  DOB: 03/07/1956  DX:AJOINOMV, Matthew Rumps, NP  Matthew Gala, MD   REFERRING PHYSICIAN: [name], MD  DIAGNOSIS:    ICD-10-CM   1. 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 is a 62 y.o. male who presented with left parotid swelling.  He recalls this was first present several years ago but recently it grew rapidly.  Ultimately, the patient saw Dr. Constance Holster who ordered a CT and done on 10/16/17. The CT scan revealed 6.6 x 3.8 x 5.5 cm heterogeneously mass centered on the accessory parotid tissue lateral to the masseter muscle. The tiumor extends into the left parotid duct anteriorly and enlarged left level 2 lymph nodes showed to be concerning for metastatic diease.   The patient first noticed the mass in 2010, he said he went to the dentist and he previously believed it was a reaction to medication, the swelling then went away. Mr. [name] expressed that a month ago the mass was much smaller then it is now. He has been taking benadryl for the mass. He denies having any numbing sensation, tingling, weight loss, or  weakness. He stated that he feels a throbbing sensation at night and that he feels that the mass has stretched his left eyelid. Patient has no history of cancer in family, or sunburns. He denies being a smoker, he does however have a history of a heavy drinker but now drinks occasionally, he stated about 3 beers and 3 glasses of a wine once a week. Patient denies having claustrophobia. Denies recurrent headaches, SOB, chest pains, issues with urination or bowel movement. Patient has no history anxiety, depression, strokes or heart attacks. No cranial nerve symptoms. No constitutional symptoms.  Patient works for a Engineer, maintenance (IT) at nights, he stated he has trouble sleeping at times and reported taking melatonin to help. Patient is also taking lisinopril-hydrochlorothiazide.   Swallowing issues, if any: no  Weight Changes: no  Pain status: Feels throbbing at night when trying to sleep  Other symptoms: no  Tobacco history, if any: no  ETOH abuse, if any: occasional drinker - see above  Prior cancers, if any: no  PREVIOUS RADIATION THERAPY: No  PAST MEDICAL HISTORY:  has a past medical history of GERD (gastroesophageal reflux disease) and Hypertension.    PAST SURGICAL HISTORY: Past Surgical History:  Procedure Laterality Date  . APPENDECTOMY     2nd grade  . LEG SURGERY Left    surgery for a broken leg when he was 62 years old     FAMILY HISTORY: family history includes Arthritis in his  mother; Diabetes in his brother and father; Hypertension in his mother.  SOCIAL HISTORY:  reports that he has never smoked. He has never used smokeless tobacco. He reports that he drank alcohol. He reports that he does not use drugs. Lives in [place], [place].  ALLERGIES: Pollen extract  MEDICATIONS:  Current Outpatient Medications  Medication Sig Dispense Refill  . atenolol (TENORMIN) 50 MG tablet Take 1 tablet (50 mg total) by mouth daily. 90 tablet 3  . lisinopril-hydrochlorothiazide  (PRINZIDE,ZESTORETIC) 20-25 MG tablet Take 1 tablet by mouth daily. 90 tablet 3  . pantoprazole (PROTONIX) 40 MG tablet Take 1 tablet (40 mg total) by mouth daily. 90 tablet 3   No current facility-administered medications for this encounter.     REVIEW OF SYSTEMS:  A 10+ POINT REVIEW OF SYSTEMS WAS OBTAINED including neurology, dermatology, psychiatry, cardiac, respiratory, lymph, extremities, GI, GU, Musculoskeletal, constitutional, HEENT.  All pertinent positives are noted in the HPI.  All others are negative.    PHYSICAL EXAM:  height is 5\' 8"  (1.727 m) and weight is 207 lb 9.6 oz (94.2 kg). His temperature is 98.6 F (37 C). His blood pressure is 144/76 (abnormal) and his pulse is 64. His respiration is 18 and oxygen saturation is 100%.    General: Alert and oriented, in no acute distress HEENT: Head is normocephalic. Extraocular movements are intact. Oropharynx is clear. Metal filling in his teeth and is missing some of his teeth. Tongue is midline. No oralpharyneal lesions Neck: Neck is supple, no appreciable palpable cervical or supraclavicular lymphadenopathy bilaterally. Bulky mass in the region of the L parotid gland, several cm, in dimension. The overlaying skin is erythematous with possible dermolymphatic involvement. No posterior auricular lesions. The mass is fixated Heart: Regular in rate and rhythm with no murmurs, rubs, or gallops. Chest: Clear to auscultation bilaterally, with no rhonchi, wheezes, or rales. Abdomen: Soft, nontender, nondistended, with no rigidity or guarding. Extremities: No edema in his ankles. Lymphatics: see Neck Exam Skin: No concerning lesions. Musculoskeletal: symmetric strength and muscle tone throughout. Neurologic: Cranial nerves II through XII are grossly intact. No obvious focalities. Speech is fluent. Coordination is intact. Facial and motor sensation is intact. Marland Kitchen Psychiatric: Judgment and insight are intact. Affect is appropriate.  ECOG =  0  0 - Asymptomatic (Fully active, able to carry on all predisease activities without restriction)  1 - Symptomatic but completely ambulatory (Restricted in physically strenuous activity but ambulatory and able to carry out work of a light or sedentary nature. For example, light housework, office work)  2 - Symptomatic, <50% in bed during the day (Ambulatory and capable of all self care but unable to carry out any work activities. Up and about more than 50% of waking hours)  3 - Symptomatic, >50% in bed, but not bedbound (Capable of only limited self-care, confined to bed or chair 50% or more of waking hours)  4 - Bedbound (Completely disabled. Cannot carry on any self-care. Totally confined to bed or chair)  5 - Death   Eustace Pen MM, Creech RH, Tormey DC, et al. [phone]). "Toxicity and response criteria of the [facility]". [place] Oncol. 5 (6): 649-55   LABORATORY DATA:  Lab Results  Component Value Date   WBC 6.2 10/31/2017   HGB 11.8 (L) 10/31/2017   HCT 35.3 (L) 10/31/2017   MCV 86.8 10/31/2017   PLT 247 10/31/2017   CMP     Component Value Date/Time   NA 139 10/31/2017 2202  K 4.1 10/31/2017 0938   CL 102 10/31/2017 0938   CO2 28 10/31/2017 0938   GLUCOSE 116 10/31/2017 0938   BUN 15 10/31/2017 0938   CREATININE 1.15 10/31/2017 0938   CALCIUM 10.8 (H) 10/31/2017 0938   PROT 8.4 (H) 10/31/2017 0938   ALBUMIN 3.9 10/31/2017 0938   AST 20 10/31/2017 0938   ALT 16 10/31/2017 0938   ALKPHOS 66 10/31/2017 0938   BILITOT 0.5 10/31/2017 0938   GFRNONAA >60 10/31/2017 0938   GFRAA >60 10/31/2017 0938         RADIOGRAPHY reviewed by me personally: Ct Soft Tissue Neck W Contrast  Result Date: 10/16/2017 CLINICAL DATA:  Left facial mass.  Parotid mass. Creatinine was obtained on site at [place] at [address] Results: Creatinine 1.0 mg/dL. EXAM: CT NECK WITH CONTRAST TECHNIQUE: Multidetector CT imaging of the neck was performed  using the standard protocol following the bolus administration of intravenous contrast. CONTRAST:  37mL ISOVUE-300 IOPAMIDOL (ISOVUE-300) INJECTION 61% COMPARISON:  None. FINDINGS: Pharynx and larynx: Asymmetric soft tissue is present at the left palatine tonsil in glossal tonsillar sulcus. No discrete mass is not visualized. Parapharyngeal fat is clear. The oropharynx and hypopharynx are otherwise within normal limits. The epiglottis is normal. Vocal cords are midline and symmetric. Salivary glands: The submandibular glands are within normal limits bilaterally. The right parotid gland is unremarkable. Accessory parotid tissue extends lateral to the masticator space. A heterogeneous mass lesion is centered just lateral to the masseter muscle measuring 6.6 x 3.8 x 5.5 cm. There is asymmetric hyperdense soft tissue and thickening of the left parotid duct lateral to its opening. This is centered along the same location is the accessory parotid tissue on the left. The more posterior left parotid gland is markedly atrophic with fatty replacement. At least 3 benign-appearing intraparotid lymph nodes are present. Thyroid: Normal. Lymph nodes: A left level 2 lymph node homogeneously enhances measuring 1.4 x 1.5 x 1.1 cm. A smaller similar dense node is present just below this. Smaller level 2 nodes are present just above this as well. No other pathologic nodes are evident. Vascular: Negative. Limited intracranial: Within normal limits. Visualized orbits: Unremarkable. Mastoids and visualized paranasal sinuses: Mucosal thickening is present along the floor of maxillary sinuses bilaterally, left greater than right. No fluid levels are present. The mastoid air cells are clear. Skeleton: Mild degenerative changes are most evident at C5-6. No significant stenosis is present. No focal lytic or blastic lesions are present. Upper chest: The lung apices are clear. IMPRESSION: 1. 6.6 x 3.8 x 5.5 cm heterogeneously enhancing mass  centered on the accessory parotid tissue lateral to the masseter muscle. 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 Alternatively, this could represent metastatic disease. 2. Tumor extends into the left parotid duct anteriorly. 3. Enlarged left level 2 lymph node is concerning for metastatic disease. 4. Asymmetry of soft tissue at the left palatine tonsil and glossal tonsillar also cusp. Please correlate with direct examination and possible biopsy. Electronically Signed   By: San Morelle M.D.   On: 10/16/2017 13:31      IMPRESSION/PLAN: this is the CT of an otherwise healthy 62 yo [place] American male with a rapidly enlarging parotid mass, FNA suggestive of SCC. Dr. Constance Holster and I spoke and he states that mass has progressed noticeably since CT  which was 2 wks ago.  Impending skin invasion on exam today.    No signs of cranial nerve deficits today.  I'm  ordering MRI face and PET but will not necessarily let these slow down start of treatment.    Dr Constance Holster states that facial nerve would almost definitely need to be sacrificed with upfront surgery. I explained to the patient that options in addition to upfront surgery would include induction RT or ChRT or even Chemo alone for his rapidly growing parotid tumor, but that such approaches are not standard.  That being said, I have seen aggressive parotid tumors shrink drastically with RT alone for palliative cases, and therefore urgent induction RT +/- chemo for this challenging case may reduce the morbidity of surgery.  We discussed the potential risks, benefits, and side effects of radiotherapy. We talked in detail about acute and late effects. We discussed that some of the most bothersome acute effects may be mucositis, dysgeusia, salivary changes, skin irritation, hair loss, dehydration, weight loss and fatigue. We talked about late effects which include but are not necessarily limited to dysphagia,  Xerostomia, and neck edema. No  guarantees of treatment were given. A consent form was signed and placed in the patient's medical record. The patient is enthusiastic about proceeding with treatment. I look forward to participating in the patient's care.    Simulation (treatment planning) will take place tentatively next week after tumor board discussion to firm up disposition.  In the meantime I will order PET and MRI face for staging.  We may start RT before these scans are complete, due to the urgency of treatment  We also discussed that the treatment of head and neck cancer is a multidisciplinary process to maximize treatment outcomes and quality of life. For this reason  the following referrals have been or will be made:  On 11/04/17 patient will see Dr. Lebron Conners, Medical oncology to discuss chemotherapy   Dentistry for dental evaluation , advice on reducing risk of cavities, osteoradionecrosis, or other oral issues.   Nutritionist for nutrition support during and after treatment.  Social work for social support.   Physical therapy due to risk of lymphedema in neck and deconditioning.  Baseline labs __________________________________________   Matthew Gibson, MD   This document serves as a record of services personally performed by Matthew Gibson MD. It was created on her behalf by Delton Coombes, a trained medical scribe. The creation of this record is based on the scribe's personal observations and the provider's statements to them.

## 2017-10-31 NOTE — Telephone Encounter (Signed)
Appointment scheduled and notified patient of Date/Time/Location per 4/26 los

## 2017-11-03 ENCOUNTER — Telehealth: Payer: Self-pay | Admitting: *Deleted

## 2017-11-03 NOTE — Telephone Encounter (Signed)
Oncology Nurse Navigator Documentation  Called patient, spoke with his mother as he was at work.  She acknowledged she had spoken with ENT Dr. Constance Holster this morning, indicated son is moving forward with surgery scheduled for 5/2, requested I cancel tomorrow's appts with MedOnc and Dental Medicine.  I supported the cancellation of MedOnc, explained the importance of keeping Dental Medicine consult pending need for post-surgical RT, explained reasoning.  She voiced understanding.   I provided directions to Dental Medicine for 11:00 appt, notified Dr. Lebron Conners of MedOnc cancellation.  Gayleen Orem, RN, BSN Head & Neck Oncology Nurse [facility] [phone]

## 2017-11-04 ENCOUNTER — Other Ambulatory Visit (HOSPITAL_COMMUNITY): Payer: BLUE CROSS/BLUE SHIELD | Admitting: Dentistry

## 2017-11-04 ENCOUNTER — Encounter (HOSPITAL_COMMUNITY): Payer: Self-pay | Admitting: Dentistry

## 2017-11-04 ENCOUNTER — Ambulatory Visit: Payer: BLUE CROSS/BLUE SHIELD | Admitting: Hematology and Oncology

## 2017-11-04 ENCOUNTER — Ambulatory Visit (HOSPITAL_COMMUNITY): Payer: Self-pay | Admitting: Dentistry

## 2017-11-04 VITALS — BP 135/71 | HR 69 | Temp 98.7°F

## 2017-11-04 DIAGNOSIS — K08409 Partial loss of teeth, unspecified cause, unspecified class: Secondary | ICD-10-CM

## 2017-11-04 DIAGNOSIS — K031 Abrasion of teeth: Secondary | ICD-10-CM

## 2017-11-04 DIAGNOSIS — K029 Dental caries, unspecified: Secondary | ICD-10-CM

## 2017-11-04 DIAGNOSIS — C07 Malignant neoplasm of parotid gland: Secondary | ICD-10-CM

## 2017-11-04 DIAGNOSIS — K06010 Localized gingival recession, unspecified: Secondary | ICD-10-CM

## 2017-11-04 DIAGNOSIS — K036 Deposits [accretions] on teeth: Secondary | ICD-10-CM

## 2017-11-04 DIAGNOSIS — M264 Malocclusion, unspecified: Secondary | ICD-10-CM

## 2017-11-04 DIAGNOSIS — K0530 Chronic periodontitis, unspecified: Secondary | ICD-10-CM

## 2017-11-04 DIAGNOSIS — Z01818 Encounter for other preprocedural examination: Secondary | ICD-10-CM | POA: Diagnosis not present

## 2017-11-04 DIAGNOSIS — K045 Chronic apical periodontitis: Secondary | ICD-10-CM

## 2017-11-04 NOTE — Progress Notes (Signed)
DENTAL CONSULTATION  Date of Consultation:  11/04/2017 Patient Name:   Matthew Meyer. Date of Birth:   [birth year] Medical Record Number: [id number]  VITALS: BP 135/71 (BP Location: Right Arm)   Pulse 69   Temp 98.7 F (37.1 C)   CHIEF COMPLAINT: Patient referred by Dr. Isidore Moos for a dental consultation.  HPI: Matthew Meyer. is a 62 year old male recently diagnosed with cancer of the left parotid gland. Patient with anticipated surgical resection with Dr. Constance Holster followed by postoperative radiation therapy with Dr. Isidore Moos. Patient is now seen as part of a preradiation therapy dental protocol examination.  The patient currently denies acute toothaches, swellings, or abscesses. Patient was last seen by a dentist in 2014. Patient saw Dr. Mikey Bussing, a periodontist, at that time for periodontal therapy. Patient was dismissed back to his primary dentist after he did not follow-up with continued periodontal therapy. Patient initially saw Dr. Geanie Cooley in 2013 for exam, radiographs, and initial periodontal therapy. Patient denies regular dental care at this time. Patient denies having dental phobia.  PROBLEM LIST: Patient Active Problem List   Diagnosis Date Noted  . Cancer of parotid gland ([place]) 10/31/2017    Priority: High  . Gastroesophageal reflux disease with esophagitis 05/27/2017  . Hypertension, essential 05/27/2017  . Allergic rhinitis 11/21/2016    PMH: Past Medical History:  Diagnosis Date  . GERD (gastroesophageal reflux disease)   . Hypertension     PSH: Past Surgical History:  Procedure Laterality Date  . APPENDECTOMY     2nd grade  . LEG SURGERY Left    surgery for a broken leg when he was 62 years old     ALLERGIES: Allergies  Allergen Reactions  . Pollen Extract     MEDICATIONS: Current Outpatient Medications  Medication Sig Dispense Refill  . atenolol (TENORMIN) 50 MG tablet Take 1 tablet (50 mg total) by mouth daily. 90 tablet 3   . lisinopril-hydrochlorothiazide (PRINZIDE,ZESTORETIC) 20-25 MG tablet Take 1 tablet by mouth daily. 90 tablet 3  . pantoprazole (PROTONIX) 40 MG tablet Take 1 tablet (40 mg total) by mouth daily. 90 tablet 3   No current facility-administered medications for this visit.     LABS: Lab Results  Component Value Date   WBC 6.2 10/31/2017   HGB 11.8 (L) 10/31/2017   HCT 35.3 (L) 10/31/2017   MCV 86.8 10/31/2017   PLT 247 10/31/2017      Component Value Date/Time   NA 139 10/31/2017 0938   K 4.1 10/31/2017 0938   CL 102 10/31/2017 0938   CO2 28 10/31/2017 0938   GLUCOSE 116 10/31/2017 0938   BUN 15 10/31/2017 0938   CREATININE 1.15 10/31/2017 0938   CALCIUM 10.8 (H) 10/31/2017 0938   GFRNONAA >60 10/31/2017 0938   GFRAA >60 10/31/2017 7829   No results found for: INR, PROTIME No results found for: PTT  SOCIAL HISTORY: Social History   Socioeconomic History  . Marital status: Single    Spouse name: Not on file  . Number of children: Not on file  . Years of education: Not on file  . Highest education level: Not on file  Occupational History  . Not on file  Social Needs  . Financial resource strain: Not on file  . Food insecurity:    Worry: Not on file    Inability: Not on file  . Transportation needs:    Medical: Not on file    Non-medical: Not on file  Tobacco Use  .  Smoking status: Never Smoker  . Smokeless tobacco: Never Used  Substance and Sexual Activity  . Alcohol use: Not Currently  . Drug use: No  . Sexual activity: Not Currently  Lifestyle  . Physical activity:    Days per week: Not on file    Minutes per session: Not on file  . Stress: Not on file  Relationships  . Social connections:    Talks on phone: Not on file    Gets together: Not on file    Attends religious service: Not on file    Active member of club or organization: Not on file    Attends meetings of clubs or organizations: Not on file    Relationship status: Not on file  .  Intimate partner violence:    Fear of current or ex partner: Not on file    Emotionally abused: Not on file    Physically abused: Not on file    Forced sexual activity: Not on file  Other Topics Concern  . Not on file  Social History Narrative  . Not on file    FAMILY HISTORY: Family History  Problem Relation Age of Onset  . Arthritis Mother   . Hypertension Mother   . Diabetes Father   . Diabetes Brother     REVIEW OF SYSTEMS: Reviewed with the patient as per History of present illness. Psych: Patient denies having dental phobia.  DENTAL HISTORY: CHIEF COMPLAINT: Patient referred by Dr. Isidore Moos for a dental consultation.  HPI: Matthew Meyer. is a 62 year old male recently diagnosed with cancer of the left parotid gland. Patient with anticipated surgical resection with Dr. Constance Holster followed by postoperative radiation therapy with Dr. Isidore Moos. Patient is now seen as part of a preradiation therapy dental protocol examination.  The patient currently denies acute toothaches, swellings, or abscesses. Patient was last seen by a dentist in 2014. Patient saw Dr. Mikey Bussing, a periodontist, at that time for periodontal therapy. Patient was dismissed back to his primary dentist after he did not follow-up with continued periodontal therapy. Patient initially saw Dr. Geanie Cooley in 2013 for exam, radiographs, and initial periodontal therapy. Patient denies regular dental care at this time. Patient denies having dental phobia.  DENTAL EXAMINATION: GENERAL: The patient is a well-developed, well-nourished male in no acute distress. HEAD AND NECK:  Patient has a significant left facial swelling consistent with cancer of the parotid gland diagnosis. No right neck lymphadenopathy is palpated today. Patient denies acute TMJ symptoms.Patient has a maximum interincisal opening of 50 mm. INTRAORAL EXAM:  Patient has normal saliva. There is no evidence of oral abscess formation. DENTITION:   Patient is missing tooth numbers 1, 5, 16, 17, 18, and 32. Tooth numbers 20 and 29 have a facial enamel defect. PERIODONTAL:  Patient has chronic periodontal disease with plaque and calculus accumulations, generalized gingival recession, and mandibular anterior incipient tooth mobility. There is incipient to moderate bone loss noted. Radiographic calculus is noted. Patient does have a history of previous periodontal therapy with Dr. Geralynn Ochs, the periodontist. Patient had deep scaling by patient report. DENTAL CARIES/SUBOPTIMAL RESTORATIONS:  Multiple dental caries are noted as per dental charting form. Multiple flexure lesions are noted. ENDODONTIC:  Patient currently denies acute pulpitis symptoms. Patient does have periapical pathology at the apices of tooth #14. The patient has a previous root canal therapy associated with tooth #9. CROWN AND BRIDGE:  Patient has multiple crown restorations. There are recurrent caries around the crown of tooth #15. PROSTHODONTIC:  Patient denies having partial dentures. OCCLUSION: Patient has a poor occlusal scheme secondary to multiple missing teeth, supra-eruption and drifting of the unopposed teeth into the edentulous areas, and lack replacement of missing teeth with dental prostheses.  RADIOGRAPHIC INTERPRETATION: An orthopantogram was taken and supplemented with a full series of dental radiographs. There are multiple missing teeth. Multiple dental caries are noted. There is supra-eruption and drifting of the unopposed teeth into the edentulous areas.  Multiple diastemas are noted.There is previous root canal therapy associated with tooth #8. There is moderate bone loss noted. Multiple amalgam and resin restorations are noted.There are crown restorations on tooth numbers 2 and 15. Recurrent caries are noted around the crown on tooth #15.  There is a periapical radiolucency at the apices of tooth #14.   ASSESSMENTS: 1. Cancer of the left parotid gland 2.  Anticipated surgical resection followed by postoperative radiation therapy 3. Preradiation therapy dental protocol examination 4. Chronic apical periodontitis 5. Dental caries 6. Chronic periodontitis with bone loss 7. Accretions 8. Gingival recession 9. Multiple flexure lesions  10. Incipient mandibular anterior tooth mobility 11.Multiple missing teeth 12. Multiple diastemas 13. Poor occlusal scheme and malocclusion   PLAN/RECOMMENDATIONS: 1. I discussed the risks, benefits, and complications of various treatment options with the patient in relationship to his medical and dental conditions, anticipated radiation therapy, and radiation therapy side effects to include xerostomia, radiation caries, trismus, mucositis, taste changes, gum and jawbone changes, and risk for infection and osteoradionecrosis. We discussed various treatment options to include no treatment, multiple extractions with alveoloplasty, pre-prosthetic surgery as indicated, periodontal therapy, dental restorations, root canal therapy, crown and bridge therapy, implant therapy, and replacement of missing teeth as indicated. The patient currently wishes to proceed with extraction of tooth numbers 14 and 15 with alveoloplasty along with gross debridement of remaining dentition in the operating room with general anesthesia. Unfortunately, this can not be coordinated with Dr. Janeice Robinson surgery for this coming Thursday. I will discuss the potential timing of dental extraction procedures with Dr. Constance Holster and coordinate this procedure with the patient as indicated. The patient does agree to proceed today with the impressions today for fabrication of upper and lower fluoride trays and scatter protection devices as indicated. The patient will then follow-up with a dentist of his choice for continued dental care 3 months after his radiation therapy has been complete.   2. Discussion of findings with medical team and coordination of future medical  and dental care as needed.  I spent in excess of  120 minutes during the conduct of this consultation and >50% of this time involved direct face-to-face encounter for counseling and/or coordination of the patient's care.    Lenn Cal, DDS

## 2017-11-05 ENCOUNTER — Other Ambulatory Visit: Payer: Self-pay

## 2017-11-05 ENCOUNTER — Encounter (HOSPITAL_COMMUNITY): Payer: Self-pay | Admitting: *Deleted

## 2017-11-05 ENCOUNTER — Other Ambulatory Visit (HOSPITAL_COMMUNITY): Payer: Self-pay | Admitting: Otolaryngology

## 2017-11-05 ENCOUNTER — Telehealth: Payer: Self-pay | Admitting: *Deleted

## 2017-11-05 DIAGNOSIS — C089 Malignant neoplasm of major salivary gland, unspecified: Secondary | ICD-10-CM

## 2017-11-05 NOTE — Patient Instructions (Signed)

## 2017-11-05 NOTE — H&P (Signed)
  HPI:   Matthew Meyer is a 62 y.o. male who presents as a consult Patient.   Referring Provider: Carmelia Roller, ANP  Chief complaint: Facial swelling.  HPI: He developed severe left facial swelling about 3 weeks ago. It does not hurt. It seems to get more tight when he eats. He had something like this about 10 years ago that was treated with medication and resolved. No history of skin cancer.  PMH/Meds/All/SocHx/FamHx/ROS:   Past Medical History:  Diagnosis Date  . Acid reflux  . Hypertension   Past Surgical History:  Procedure Laterality Date  . ANKLE SURGERY  . APPENDECTOMY   No family history of bleeding disorders, wound healing problems or difficulty with anesthesia.   Social History   Social History  . Marital status: Single  Spouse name: N/A  . Number of children: N/A  . Years of education: N/A   Occupational History  . Not on file.   Social History Main Topics  . Smoking status: Never Smoker  . Smokeless tobacco: Never Used  . Alcohol use Not on file  . Drug use: Unknown  . Sexual activity: Not on file   Other Topics Concern  . Not on file   Social History Narrative  . No narrative on file   Current Outpatient Prescriptions:  . atenolol (TENORMIN) 50 MG tablet, take 1 tablet by mouth daily, Disp: , Rfl:  . lisinopril-hydrochlorothiazide (PRINZIDE,ZESTORETIC) 20-25 mg per tablet, TAKE ONE TABLET BY MOUTH ONE TIME DAILY , Disp: , Rfl:  . pantoprazole (PROTONIX) 40 MG tablet, TAKE ONE TABLET BY MOUTH ONE TIME DAILY , Disp: , Rfl:   A complete ROS was performed with pertinent positives/negatives noted in the HPI. The remainder of the ROS are negative.   Physical Exam:   Ht 1.702 m (5\' 7" )  Wt 98.9 kg (218 lb)  BMI 34.14 kg/m   General: Healthy and alert, in no distress, breathing easily. Normal affect. In a pleasant mood. Head: Normocephalic, atraumatic. No masses, or scars. Eyes: Pupils are equal, and reactive to light. Vision is grossly  intact. No spontaneous or gaze nystagmus. Ears: Ear canals are clear. Tympanic membranes are intact, with normal landmarks and the middle ears are clear and healthy. Hearing: Grossly normal. Nose: Nasal cavities are clear with healthy mucosa, no polyps or exudate. Airways are patent. Face: 6 cm nonfluctuant left parotid mass. Oral Cavity: No mucosal abnormalities are noted. Tongue with normal mobility. Dentition appears healthy. Clear saliva expressible from the left parotid duct. Oropharynx: Tonsils are symmetric. There are no mucosal masses identified. Tongue base appears normal and healthy. Larynx/Hypopharynx: deferred Chest: Deferred Neck: No palpable masses, no cervical adenopathy, no thyroid nodules or enlargement. Neuro: Cranial nerves II-XII with normal function. Balance: Normal gate. Other findings: none.  Independent Review of Additional Tests or Records:  none  Procedures:  none  Impression & Plans:  Left parotid mass. Suspicious for neoplasm based on the lack of tenderness and fluctuance. Recommend CT evaluation. We will discuss results when available.

## 2017-11-05 NOTE — Progress Notes (Signed)
Spoke with pt for pre-op call. Pt denies cardiac history, chest pain or sob. Pt states he is not diabetic. Instructed pt to hold Multivitamins and Vitamin E as of today.

## 2017-11-05 NOTE — Telephone Encounter (Signed)
CALLED PATIENT TO INFORM OF APPT. WITH DR. Constance Holster ON 11-13-17 - ARRIVAL TIME - 2:15 PM , LVM FOR A RETURN CALL

## 2017-11-06 ENCOUNTER — Encounter (HOSPITAL_COMMUNITY): Payer: Self-pay | Admitting: Surgery

## 2017-11-06 ENCOUNTER — Ambulatory Visit (HOSPITAL_COMMUNITY): Payer: BLUE CROSS/BLUE SHIELD | Admitting: Anesthesiology

## 2017-11-06 ENCOUNTER — Telehealth: Payer: Self-pay | Admitting: *Deleted

## 2017-11-06 ENCOUNTER — Encounter (HOSPITAL_COMMUNITY): Payer: Self-pay

## 2017-11-06 ENCOUNTER — Encounter (HOSPITAL_COMMUNITY)
Admission: AD | Disposition: A | Discharge status: Home / Self Care | Payer: Self-pay | Source: Ambulatory Visit | Attending: Otolaryngology

## 2017-11-06 ENCOUNTER — Other Ambulatory Visit: Payer: Self-pay

## 2017-11-06 ENCOUNTER — Ambulatory Visit (HOSPITAL_COMMUNITY)
Admission: RE | Admit: 2017-11-06 | Discharge: 2017-11-06 | Disposition: A | Discharge status: Home / Self Care | Payer: BLUE CROSS/BLUE SHIELD | Source: Ambulatory Visit | Attending: Otolaryngology | Admitting: Otolaryngology

## 2017-11-06 ENCOUNTER — Other Ambulatory Visit: Payer: Self-pay | Admitting: *Deleted

## 2017-11-06 ENCOUNTER — Inpatient Hospital Stay (HOSPITAL_COMMUNITY)
Admission: AD | Admit: 2017-11-06 | Discharge: 2017-11-08 | DRG: 130 | Disposition: A | Discharge status: Home / Self Care | Payer: BLUE CROSS/BLUE SHIELD | Source: Ambulatory Visit | Attending: Otolaryngology | Admitting: Otolaryngology

## 2017-11-06 DIAGNOSIS — I10 Essential (primary) hypertension: Secondary | ICD-10-CM | POA: Diagnosis not present

## 2017-11-06 DIAGNOSIS — C07 Malignant neoplasm of parotid gland: Secondary | ICD-10-CM | POA: Diagnosis not present

## 2017-11-06 DIAGNOSIS — J309 Allergic rhinitis, unspecified: Secondary | ICD-10-CM | POA: Diagnosis not present

## 2017-11-06 DIAGNOSIS — D490 Neoplasm of unspecified behavior of digestive system: Secondary | ICD-10-CM

## 2017-11-06 DIAGNOSIS — K1123 Chronic sialoadenitis: Secondary | ICD-10-CM | POA: Diagnosis not present

## 2017-11-06 DIAGNOSIS — C089 Malignant neoplasm of major salivary gland, unspecified: Secondary | ICD-10-CM | POA: Diagnosis present

## 2017-11-06 DIAGNOSIS — K219 Gastro-esophageal reflux disease without esophagitis: Secondary | ICD-10-CM | POA: Diagnosis not present

## 2017-11-06 DIAGNOSIS — Z6834 Body mass index (BMI) 34.0-34.9, adult: Secondary | ICD-10-CM

## 2017-11-06 DIAGNOSIS — D110 Benign neoplasm of parotid gland: Secondary | ICD-10-CM | POA: Diagnosis not present

## 2017-11-06 HISTORY — PX: RADICAL NECK DISSECTION: SHX2284

## 2017-11-06 HISTORY — PX: SKIN SPLIT GRAFT: SHX444

## 2017-11-06 HISTORY — PX: PAROTIDECTOMY: SHX2163

## 2017-11-06 SURGERY — EXCISION, PAROTID GLAND
Anesthesia: General | Site: Neck | Laterality: Left

## 2017-11-06 MED ORDER — EPINEPHRINE PF 1 MG/ML IJ SOLN
INTRAMUSCULAR | Status: AC
  Filled 2017-11-06: qty 1

## 2017-11-06 MED ORDER — ONDANSETRON HCL 4 MG/2ML IJ SOLN
INTRAMUSCULAR | Status: AC
Start: 2017-11-06 — End: ?
  Filled 2017-11-06: qty 2

## 2017-11-06 MED ORDER — LIDOCAINE-EPINEPHRINE 1 %-1:100000 IJ SOLN
INTRAMUSCULAR | Status: DC | PRN
  Administered 2017-11-06: 20 mL

## 2017-11-06 MED ORDER — LIDOCAINE 2% (20 MG/ML) 5 ML SYRINGE
INTRAMUSCULAR | Status: AC
Start: 2017-11-06 — End: ?
  Filled 2017-11-06: qty 5

## 2017-11-06 MED ORDER — PROMETHAZINE HCL 25 MG/ML IJ SOLN: 6.2500 mg | INTRAMUSCULAR | Status: DC | PRN

## 2017-11-06 MED ORDER — PROPOFOL 10 MG/ML IV BOLUS
INTRAVENOUS | Status: AC
  Filled 2017-11-06: qty 20

## 2017-11-06 MED ORDER — FENTANYL CITRATE (PF) 100 MCG/2ML IJ SOLN
INTRAMUSCULAR | Status: DC | PRN
  Administered 2017-11-06: 150 ug via INTRAVENOUS
  Administered 2017-11-06: 100 ug via INTRAVENOUS

## 2017-11-06 MED ORDER — PROMETHAZINE HCL 25 MG PO TABS: 25.0000 mg | ORAL_TABLET | Freq: Four times a day (QID) | ORAL | Status: DC | PRN

## 2017-11-06 MED ORDER — SUCCINYLCHOLINE CHLORIDE 200 MG/10ML IV SOSY
PREFILLED_SYRINGE | INTRAVENOUS | Status: AC
  Filled 2017-11-06: qty 10

## 2017-11-06 MED ORDER — SUCCINYLCHOLINE CHLORIDE 20 MG/ML IJ SOLN
INTRAMUSCULAR | Status: DC | PRN
  Administered 2017-11-06: 140 mg via INTRAVENOUS

## 2017-11-06 MED ORDER — ADULT MULTIVITAMIN W/MINERALS CH
1.0000 | ORAL_TABLET | Freq: Every day | ORAL | Status: DC
  Administered 2017-11-06 – 2017-11-08 (×3): 1 via ORAL
  Filled 2017-11-06 (×5): qty 1

## 2017-11-06 MED ORDER — STERILE WATER FOR IRRIGATION IR SOLN
Status: DC | PRN
  Administered 2017-11-06: 1000 mL

## 2017-11-06 MED ORDER — MIDAZOLAM HCL 2 MG/2ML IJ SOLN: 0.5000 mg | Freq: Once | INTRAMUSCULAR | Status: DC | PRN

## 2017-11-06 MED ORDER — DEXTROSE-NACL 5-0.9 % IV SOLN
INTRAVENOUS | Status: DC
  Administered 2017-11-06: 19:00:00 via INTRAVENOUS

## 2017-11-06 MED ORDER — HYDROMORPHONE HCL 2 MG/ML IJ SOLN
0.2500 mg | INTRAMUSCULAR | Status: DC | PRN
  Administered 2017-11-06 (×2): 0.5 mg via INTRAVENOUS

## 2017-11-06 MED ORDER — HYDROCHLOROTHIAZIDE 25 MG PO TABS
25.0000 mg | ORAL_TABLET | Freq: Every day | ORAL | Status: DC
  Administered 2017-11-07 – 2017-11-08 (×2): 25 mg via ORAL
  Filled 2017-11-06 (×2): qty 1

## 2017-11-06 MED ORDER — MIDAZOLAM HCL 5 MG/5ML IJ SOLN
INTRAMUSCULAR | Status: DC | PRN
  Administered 2017-11-06: 2 mg via INTRAVENOUS

## 2017-11-06 MED ORDER — FENTANYL CITRATE (PF) 250 MCG/5ML IJ SOLN
INTRAMUSCULAR | Status: AC
Start: 2017-11-06 — End: ?
  Filled 2017-11-06: qty 5

## 2017-11-06 MED ORDER — MINERAL OIL LIGHT 100 % EX OIL
TOPICAL_OIL | CUTANEOUS | Status: AC
  Filled 2017-11-06: qty 50

## 2017-11-06 MED ORDER — ONDANSETRON HCL 4 MG/2ML IJ SOLN
INTRAMUSCULAR | Status: DC | PRN
  Administered 2017-11-06: 4 mg via INTRAVENOUS

## 2017-11-06 MED ORDER — ATENOLOL 50 MG PO TABS
50.0000 mg | ORAL_TABLET | Freq: Every day | ORAL | Status: DC
  Administered 2017-11-07 – 2017-11-08 (×2): 50 mg via ORAL
  Filled 2017-11-06 (×2): qty 1

## 2017-11-06 MED ORDER — BACITRACIN ZINC 500 UNIT/GM EX OINT
TOPICAL_OINTMENT | CUTANEOUS | Status: DC | PRN
  Administered 2017-11-06: 1 via TOPICAL

## 2017-11-06 MED ORDER — CLINDAMYCIN PHOSPHATE 600 MG/50ML IV SOLN
600.0000 mg | Freq: Three times a day (TID) | INTRAVENOUS | Status: AC
Start: 2017-11-06 — End: 2017-11-07
  Administered 2017-11-06 – 2017-11-07 (×4): 600 mg via INTRAVENOUS
  Filled 2017-11-06 (×5): qty 50

## 2017-11-06 MED ORDER — LIDOCAINE HCL (CARDIAC) PF 100 MG/5ML IV SOSY
PREFILLED_SYRINGE | INTRAVENOUS | Status: DC | PRN
  Administered 2017-11-06: 30 mg via INTRAVENOUS

## 2017-11-06 MED ORDER — PANTOPRAZOLE SODIUM 40 MG PO TBEC
40.0000 mg | DELAYED_RELEASE_TABLET | Freq: Every day | ORAL | Status: DC
  Administered 2017-11-06 – 2017-11-08 (×3): 40 mg via ORAL
  Filled 2017-11-06 (×3): qty 1

## 2017-11-06 MED ORDER — PROPOFOL 10 MG/ML IV BOLUS
INTRAVENOUS | Status: DC | PRN
  Administered 2017-11-06: 200 mg via INTRAVENOUS

## 2017-11-06 MED ORDER — CALCIUM CARBONATE 1250 (500 CA) MG PO TABS
1.0000 | ORAL_TABLET | Freq: Every day | ORAL | Status: DC
  Administered 2017-11-07 – 2017-11-08 (×2): 500 mg via ORAL
  Filled 2017-11-06 (×2): qty 1

## 2017-11-06 MED ORDER — MINERAL OIL LIGHT 100 % EX OIL
TOPICAL_OIL | CUTANEOUS | Status: DC | PRN
  Administered 2017-11-06: 1 via TOPICAL

## 2017-11-06 MED ORDER — LACTATED RINGERS IV SOLN
INTRAVENOUS | Status: DC | PRN
  Administered 2017-11-06 (×3): via INTRAVENOUS

## 2017-11-06 MED ORDER — HYDROCODONE-ACETAMINOPHEN 5-325 MG PO TABS
1.0000 | ORAL_TABLET | ORAL | Status: DC | PRN
  Administered 2017-11-06 – 2017-11-08 (×4): 2 via ORAL
  Administered 2017-11-08: 1 via ORAL
  Filled 2017-11-06: qty 2
  Filled 2017-11-06: qty 1
  Filled 2017-11-06 (×3): qty 2

## 2017-11-06 MED ORDER — EPINEPHRINE PF 1 MG/ML IJ SOLN
INTRAMUSCULAR | Status: DC | PRN
  Administered 2017-11-06: 4 mg

## 2017-11-06 MED ORDER — HYDROMORPHONE HCL 2 MG/ML IJ SOLN
INTRAMUSCULAR | Status: AC
  Filled 2017-11-06: qty 1

## 2017-11-06 MED ORDER — LISINOPRIL-HYDROCHLOROTHIAZIDE 20-25 MG PO TABS: 1.0000 | ORAL_TABLET | Freq: Every day | ORAL | Status: DC

## 2017-11-06 MED ORDER — VITAMIN E 180 MG (400 UNIT) PO CAPS
400.0000 [IU] | ORAL_CAPSULE | Freq: Every day | ORAL | Status: DC
Start: 2017-11-06 — End: 2017-11-08
  Administered 2017-11-06 – 2017-11-08 (×3): 400 [IU] via ORAL
  Filled 2017-11-06 (×4): qty 1

## 2017-11-06 MED ORDER — VITAMIN D 1000 UNITS PO TABS
1000.0000 [IU] | ORAL_TABLET | Freq: Every day | ORAL | Status: DC
  Administered 2017-11-06 – 2017-11-08 (×3): 1000 [IU] via ORAL
  Filled 2017-11-06 (×3): qty 1

## 2017-11-06 MED ORDER — LISINOPRIL 20 MG PO TABS
20.0000 mg | ORAL_TABLET | Freq: Every day | ORAL | Status: DC
Start: 2017-11-06 — End: 2017-11-08
  Administered 2017-11-07 – 2017-11-08 (×2): 20 mg via ORAL
  Filled 2017-11-06 (×2): qty 1

## 2017-11-06 MED ORDER — LIDOCAINE-EPINEPHRINE 1 %-1:100000 IJ SOLN
INTRAMUSCULAR | Status: AC
  Filled 2017-11-06: qty 1

## 2017-11-06 MED ORDER — PHENYLEPHRINE 40 MCG/ML (10ML) SYRINGE FOR IV PUSH (FOR BLOOD PRESSURE SUPPORT)
PREFILLED_SYRINGE | INTRAVENOUS | Status: AC
  Filled 2017-11-06: qty 10

## 2017-11-06 MED ORDER — PROMETHAZINE HCL 25 MG RE SUPP
25.0000 mg | Freq: Four times a day (QID) | RECTAL | Status: DC | PRN
  Filled 2017-11-06: qty 1

## 2017-11-06 MED ORDER — IBUPROFEN 100 MG/5ML PO SUSP
400.0000 mg | Freq: Four times a day (QID) | ORAL | Status: DC | PRN
  Administered 2017-11-06: 400 mg via ORAL
  Filled 2017-11-06 (×2): qty 20

## 2017-11-06 MED ORDER — FENTANYL CITRATE (PF) 100 MCG/2ML IJ SOLN: INTRAMUSCULAR | Status: DC | PRN

## 2017-11-06 MED ORDER — BACITRACIN ZINC 500 UNIT/GM EX OINT
TOPICAL_OINTMENT | CUTANEOUS | Status: AC
  Filled 2017-11-06: qty 28.35

## 2017-11-06 MED ORDER — MEPERIDINE HCL 50 MG/ML IJ SOLN: 6.2500 mg | INTRAMUSCULAR | Status: DC | PRN

## 2017-11-06 MED ORDER — IOHEXOL 300 MG/ML  SOLN
100.0000 mL | Freq: Once | INTRAMUSCULAR | Status: AC | PRN
  Administered 2017-11-06: 75 mL via INTRAVENOUS

## 2017-11-06 MED ORDER — LACTATED RINGERS IV SOLN: INTRAVENOUS | Status: DC

## 2017-11-06 MED ORDER — EPINEPHRINE PF 1 MG/ML IJ SOLN
INTRAMUSCULAR | Status: AC
Start: 2017-11-06 — End: ?
  Filled 2017-11-06: qty 3

## 2017-11-06 MED ORDER — EPINEPHRINE HCL (NASAL) 0.1 % NA SOLN: NASAL | Status: DC | PRN

## 2017-11-06 MED ORDER — MIDAZOLAM HCL 2 MG/2ML IJ SOLN
INTRAMUSCULAR | Status: AC
  Filled 2017-11-06: qty 2

## 2017-11-06 MED ORDER — 0.9 % SODIUM CHLORIDE (POUR BTL) OPTIME
TOPICAL | Status: DC | PRN
  Administered 2017-11-06: 1000 mL

## 2017-11-06 SURGICAL SUPPLY — 79 items
APPLIER CLIP 9.375 SM OPEN (CLIP)
ATTRACTOMAT 16X20 MAGNETIC DRP (DRAPES) IMPLANT
BLADE CLIPPER SURG (BLADE) ×4 IMPLANT
BLADE DERMATOME SS (BLADE) ×4 IMPLANT
BLADE SURG 15 STRL LF DISP TIS (BLADE) ×3 IMPLANT
BLADE SURG 15 STRL SS (BLADE) ×1
BNDG CONFORM 3 STRL LF (GAUZE/BANDAGES/DRESSINGS) ×4 IMPLANT
BNDG GAUZE ELAST 4 BULKY (GAUZE/BANDAGES/DRESSINGS) ×8 IMPLANT
CANISTER SUCT 3000ML PPV (MISCELLANEOUS) ×4 IMPLANT
CLEANER TIP ELECTROSURG 2X2 (MISCELLANEOUS) ×8 IMPLANT
CLIP APPLIE 9.375 SM OPEN (CLIP) IMPLANT
CONT SPEC 4OZ CLIKSEAL STRL BL (MISCELLANEOUS) ×16 IMPLANT
CORDS BIPOLAR (ELECTRODE) ×4 IMPLANT
COTTONBALL LRG STERILE PKG (GAUZE/BANDAGES/DRESSINGS) ×8 IMPLANT
COVER SURGICAL LIGHT HANDLE (MISCELLANEOUS) ×4 IMPLANT
DERMABOND ADVANCED (GAUZE/BANDAGES/DRESSINGS) ×1
DERMABOND ADVANCED .7 DNX12 (GAUZE/BANDAGES/DRESSINGS) ×3 IMPLANT
DERMACARRIERS GRAFT 1 TO 1.5 (DISPOSABLE) ×4
DRAIN CHANNEL 15F RND FF W/TCR (WOUND CARE) IMPLANT
DRAIN HEMOVAC 7FR (DRAIN) IMPLANT
DRAIN PENROSE 18X1/4 LTX STRL (WOUND CARE) ×4 IMPLANT
DRAIN SNY 10 ROU (WOUND CARE) IMPLANT
DRAIN WOUND SNY 15 RND (WOUND CARE) IMPLANT
DRAPE HALF SHEET 40X57 (DRAPES) ×4 IMPLANT
DRAPE INCISE 23X17 IOBAN STRL (DRAPES) ×1
DRAPE INCISE IOBAN 23X17 STRL (DRAPES) ×3 IMPLANT
DRSG VERSA FOAM LRG 10X15 (GAUZE/BANDAGES/DRESSINGS) ×4 IMPLANT
ELECT COATED BLADE 2.86 ST (ELECTRODE) ×4 IMPLANT
ELECT REM PT RETURN 9FT ADLT (ELECTROSURGICAL) ×4
ELECTRODE REM PT RTRN 9FT ADLT (ELECTROSURGICAL) ×3 IMPLANT
EVACUATOR SILICONE 100CC (DRAIN) ×4 IMPLANT
FILTER STRAW FLUID ASPIR (MISCELLANEOUS) ×8 IMPLANT
FORCEPS BIPOLAR SPETZLER 8 1.0 (NEUROSURGERY SUPPLIES) ×4 IMPLANT
GAUZE SPONGE 4X4 12PLY STRL (GAUZE/BANDAGES/DRESSINGS) ×8 IMPLANT
GAUZE SPONGE 4X4 12PLY STRL LF (GAUZE/BANDAGES/DRESSINGS) ×4 IMPLANT
GAUZE SPONGE 4X4 16PLY XRAY LF (GAUZE/BANDAGES/DRESSINGS) ×24 IMPLANT
GAUZE XEROFORM 5X9 LF (GAUZE/BANDAGES/DRESSINGS) ×8 IMPLANT
GLOVE BIO SURGEON STRL SZ 6.5 (GLOVE) ×4 IMPLANT
GLOVE BIOGEL PI IND STRL 6.5 (GLOVE) ×9 IMPLANT
GLOVE BIOGEL PI IND STRL 8 (GLOVE) ×3 IMPLANT
GLOVE BIOGEL PI INDICATOR 6.5 (GLOVE) ×3
GLOVE BIOGEL PI INDICATOR 8 (GLOVE) ×1
GLOVE ECLIPSE 6.5 STRL STRAW (GLOVE) ×8 IMPLANT
GLOVE ECLIPSE 7.5 STRL STRAW (GLOVE) ×4 IMPLANT
GLOVE SURG SS PI 6.5 STRL IVOR (GLOVE) ×8 IMPLANT
GOWN STRL REUS W/ TWL LRG LVL3 (GOWN DISPOSABLE) ×15 IMPLANT
GOWN STRL REUS W/TWL LRG LVL3 (GOWN DISPOSABLE) ×5
GRAFT DERMACARRIERS 1 TO 1.5 (DISPOSABLE) ×3 IMPLANT
KIT BASIN OR (CUSTOM PROCEDURE TRAY) ×4 IMPLANT
KIT TURNOVER KIT B (KITS) ×4 IMPLANT
LOCATOR NERVE 3 VOLT (DISPOSABLE) IMPLANT
NEEDLE PRECISIONGLIDE 27X1.5 (NEEDLE) ×4 IMPLANT
NS IRRIG 1000ML POUR BTL (IV SOLUTION) ×4 IMPLANT
PAD ARMBOARD 7.5X6 YLW CONV (MISCELLANEOUS) ×8 IMPLANT
PENCIL FOOT CONTROL (ELECTRODE) ×4 IMPLANT
SHEARS HARMONIC 9CM CVD (BLADE) ×4 IMPLANT
SPECIMEN JAR MEDIUM (MISCELLANEOUS) IMPLANT
SPONGE INTESTINAL PEANUT (DISPOSABLE) IMPLANT
SPONGE LAP 18X18 X RAY DECT (DISPOSABLE) IMPLANT
STAPLER VISISTAT (STAPLE) ×4 IMPLANT
STAPLER VISISTAT 35W (STAPLE) ×4 IMPLANT
SUT CHROMIC 3 0 SH 27 (SUTURE) ×8 IMPLANT
SUT CHROMIC 4 0 PS 2 18 (SUTURE) ×4 IMPLANT
SUT CHROMIC 5 0 P 3 (SUTURE) IMPLANT
SUT ETHILON 3 0 PS 1 (SUTURE) ×8 IMPLANT
SUT ETHILON 5 0 P 3 18 (SUTURE)
SUT ETHILON 5 0 PS 2 18 (SUTURE) IMPLANT
SUT NYLON ETHILON 5-0 P-3 1X18 (SUTURE) IMPLANT
SUT SILK 2 0 REEL (SUTURE) IMPLANT
SUT SILK 2 0 SH CR/8 (SUTURE) ×12 IMPLANT
SUT SILK 3 0 SH CR/8 (SUTURE) ×4 IMPLANT
SUT SILK 4 0 REEL (SUTURE) ×4 IMPLANT
SUT VIC AB 3-0 SH 18 (SUTURE) ×4 IMPLANT
SYR 3ML LL SCALE MARK (SYRINGE) ×8 IMPLANT
SYR CONTROL 10ML LL (SYRINGE) IMPLANT
TOWEL OR 17X24 6PK STRL BLUE (TOWEL DISPOSABLE) ×4 IMPLANT
TRAY ENT MC OR (CUSTOM PROCEDURE TRAY) ×4 IMPLANT
TRAY FOLEY MTR SLVR 14FR STAT (SET/KITS/TRAYS/PACK) IMPLANT
TUBE FEEDING 10FR FLEXIFLO (MISCELLANEOUS) IMPLANT

## 2017-11-06 NOTE — Telephone Encounter (Signed)
Called patient to inform of Pet Scan on 11-07-17- arrival time - 8:30 am - @ [facility] (Radiology) , pt. to be NPO- 6 hrs. Prior to test, lvm for a return call

## 2017-11-06 NOTE — Telephone Encounter (Signed)
Called patient to inform of Pet Scan for 11-14-17 - @ 8 am @ [facility] Radiology, patient to be NPO- 6 hrs. prior to test, lvm for a return call

## 2017-11-06 NOTE — OR Nursing (Signed)
Under specimens - Specimen number one was labeled as left carotid duct margin which was incorrect. It should have been labeled left parotid duct margin. Histology was made aware of this mistake and corrected it on the printed specimen requisition under description. It could not be corrected on the requisition in the computer because the order had already been processed.

## 2017-11-06 NOTE — Transfer of Care (Signed)
Immediate Anesthesia Transfer of Care Note  Patient: Matthew Meyer.  Procedure(s) Performed: PAROTIDECTOMY (Left Face) RADICAL NECK DISSECTION (Left Neck) SKIN GRAFT SPLIT THICKNESS (Left Leg Upper)  Patient Location: PACU  Anesthesia Type:General  Level of Consciousness: awake and alert   Airway & Oxygen Therapy: Patient Spontanous Breathing and Patient connected to nasal cannula oxygen  Post-op Assessment: Report given to RN and Post -op Vital signs reviewed and stable  Post vital signs: Reviewed and stable  Last Vitals:  Vitals Value Taken Time  BP 146/73 11/06/2017  5:28 PM  Temp 36.2 C 11/06/2017  5:28 PM  Pulse 87 11/06/2017  5:29 PM  Resp 13 11/06/2017  5:29 PM  SpO2 100 % 11/06/2017  5:29 PM  Vitals shown include unvalidated device data.  Last Pain:  Vitals:   11/06/17 1728  TempSrc:   PainSc: (P) 0-No pain      Patients Stated Pain Goal: 4 (23/76/28 3151)  Complications: No apparent anesthesia complications

## 2017-11-06 NOTE — Anesthesia Postprocedure Evaluation (Signed)
Anesthesia Post Note  Patient: Matthew Meyer.  Procedure(s) Performed: PAROTIDECTOMY (Left Face) RADICAL NECK DISSECTION (Left Neck) SKIN GRAFT SPLIT THICKNESS (Left Leg Upper)     Patient location during evaluation: PACU Anesthesia Type: General Level of consciousness: awake and alert, oriented and patient cooperative Pain management: pain level controlled Vital Signs Assessment: post-procedure vital signs reviewed and stable Respiratory status: spontaneous breathing, nonlabored ventilation and respiratory function stable Cardiovascular status: blood pressure returned to baseline and stable Postop Assessment: no apparent nausea or vomiting Anesthetic complications: no Comments: Dr Constance Holster aware of facial nerve weakness    Last Vitals:  Vitals:   11/06/17 1728 11/06/17 1742  BP: (!) 146/73 (!) 151/70  Pulse: 84 84  Resp: 15 15  Temp: (!) 36.2 C   SpO2: 100% 100%    Last Pain:  Vitals:   11/06/17 1743  TempSrc:   PainSc: 6                  Ranata Laughery,E. Haylee Mcanany

## 2017-11-06 NOTE — Anesthesia Preprocedure Evaluation (Addendum)
Anesthesia Evaluation  Patient identified by MRN, date of birth, ID band Patient awake    Reviewed: Allergy & Precautions, NPO status , Patient's Chart, lab work & pertinent test results  History of Anesthesia Complications Negative for: history of anesthetic complications  Airway Mallampati: I  TM Distance: >3 FB Neck ROM: Full    Dental  (+) Dental Advisory Given, Missing   Pulmonary neg pulmonary ROS,    breath sounds clear to auscultation       Cardiovascular hypertension, Pt. on medications and Pt. on home beta blockers (-) angina Rhythm:Regular Rate:Normal     Neuro/Psych negative neurological ROS     GI/Hepatic Neg liver ROS, GERD  Medicated and Controlled,  Endo/Other  Morbid obesity  Renal/GU negative Renal ROS     Musculoskeletal negative musculoskeletal ROS (+)   Abdominal (+) + obese,   Peds  Hematology negative hematology ROS (+)   Anesthesia Other Findings Parotid tumor  Reproductive/Obstetrics                            Anesthesia Physical Anesthesia Plan  ASA: III  Anesthesia Plan: General   Post-op Pain Management:    Induction: Intravenous  PONV Risk Score and Plan: 2 and Ondansetron and Dexamethasone  Airway Management Planned: Oral ETT  Additional Equipment:   Intra-op Plan:   Post-operative Plan: Possible Post-op intubation/ventilation  Informed Consent: I have reviewed the patients History and Physical, chart, labs and discussed the procedure including the risks, benefits and alternatives for the proposed anesthesia with the patient or authorized representative who has indicated his/her understanding and acceptance.   Dental advisory given  Plan Discussed with: CRNA and Surgeon  Anesthesia Plan Comments: (Plan routine monitors, GETA, possible post op intubation/ventilation)       Anesthesia Quick Evaluation

## 2017-11-06 NOTE — Interval H&P Note (Signed)
History and Physical Interval Note:  11/06/2017 12:09 PM  [name].  has presented today for surgery, with the diagnosis of Malignant neoplasm of Parotid Gland  The various methods of treatment have been discussed with the patient and family. After consideration of risks, benefits and other options for treatment, the patient has consented to  Procedure(s) with comments: PAROTIDECTOMY (N/A) - Possible skin graft, possible nerve graft Needs RNFA RADICAL NECK DISSECTION (Left) as a surgical intervention .  The patient's history has been reviewed, patient examined, no change in status, stable for surgery.  I have reviewed the patient's chart and labs.  Questions were answered to the patient's satisfaction.     Matthew Meyer

## 2017-11-06 NOTE — Op Note (Signed)
OPERATIVE REPORT  DATE OF SURGERY: 11/06/2017  PATIENT:  Matthew Heads.,  62 y.o. male  PRE-OPERATIVE DIAGNOSIS:  Malignant neoplasm of Parotid Gland  POST-OPERATIVE DIAGNOSIS:  Malignant neoplasm of Parotid Gland  PROCEDURE:  Procedure(s): PAROTIDECTOMY Selective NECK DISSECTION SKIN GRAFT SPLIT THICKNESS  SURGEON:  Beckie Salts, MD  ASSISTANTS: Jolene Provost PA  ANESTHESIA:   General   EBL: 50 ml  DRAINS: Quarter-inch Penrose  LOCAL MEDICATIONS USED:  None  SPECIMEN: 1.  Left parotid mass with associated skin.  A single suture marks superior margin, double suture marks posterior margin. 2.  Left level 2 neck dissection. 3.  Left parotid duct margin for frozen section read 4.  Left parotid duct new margin 5.  Left parotid deep margin for frozen section.  COUNTS:  Correct  PROCEDURE DETAILS: The patient was taken to the operating room and placed on the operating table in the supine position. Following induction of general endotracheal anesthesia, the left face and left upper thigh were prepped and draped in the standard fashion.  A marking pen was used to outline the proposed skin incisions around the parotid.  A large area of skin was taken with the specimen due to infiltration of the tumor into the dermal layer.  Incision was extended down into a cervical skin crease for the neck dissection portion.  1.  Left parotidectomy with facial nerve dissection.  The skin was incised using electrocautery.  The specimen was retracted anteriorly while the ear was retracted posteriorly exposing the cartilaginous ear canal and then down to the bony ear canal.  The greater auricular nerve with 3 branches was dissected all the way back to the origin at the sternocleidomastoid muscle and this was preserved for possible skin grafting.  This was not necessary however and was ultimately removed.  Dissection down towards the temporal mastoid suture line and the posterior belly of the  digastric revealed the main trunk of the facial nerve.  The nerve was then dissected up towards the superior and inferior divisions.  The tumor all appeared to be lateral to the nerve.  All branches of the nerve were dissected and preserved.  One solitary branch that appeared to be extending right into the tumor was sacrificed.  The tumor extended down to the masseter muscle and a large portion of the masseter muscle was resected as deep margin.  An additional deep margin was sent for frozen section was negative.  The dilated duct was identified and grossly there.  There may be tumor involving the duct.  Initial frozen section revealed that this was positive for carcinoma.  The duct was then followed all the way into the buccal mucosa and sent for an additional margin.  The buccal mucosal defect was then reapproximated with interrupted 3-0 Vicryl suture.  The specimen was delivered and sent for pathologic evaluation.  2.  Left selective neck dissection.  After the parotidectomy portion was completed attention was then turned to the level 2 nodes.  There are multiple nodes identified.  The external jugular vein was ligated and divided between clamps.  4-0 silk ties were used.  The lateral fascia of the sternocleidomastoid muscle was dissected anteriorly off the muscle.  The spinal accessory nerve was identified and was dissected all the way up to the posterior belly of the digastric.  There are multiple nodes in the 2 AM to be areas.  Fibrofatty tissue was dissected off the splenius capitis and levator scapula muscles and brought under the  accessory nerve.  The remainder of level 2 was then dissected off of the cervical plexus branches brought forward towards the internal jugular vein and the jugular vein, carotid and vagus were preserved.  The hypoglossal nerve was also preserved.  Specimen ended at the submandibular gland.  Inferior aspect of the specimen ended at the level of the upper aspect of the thyroid  cartilage.  No other nodes were palpable.  The specimen was also delivered and sent for pathologic evaluation.  3..  Split-thickness skin graft.  Portions of the incision that could be closed were reapproximated in the deep layer using interrupted 3-0 chromic and skin staples on the skin.  The resulting defect was measured and a dermatome was used to harvest skin from the left thigh.  The donor site was dressed with Xeroform and Kerlix/Kling.  The graft was meshed 1.5: 1 and was sized up into the defect.  Was at approximate along the edges using combination of 3-0 chromic and small skin staples.  Additional excess skin was removed with scissors.  Xeroform was applied to the skin graft and then a foam dressing with additional 4 x 4's for support.  The bolster was secured in place using twelve 2-0 silk sutures.  1/4 inch Penrose was left in the neck dissection bed and exited through separate stab incision.  Dressing was applied.  Patient was awakened extubated and transferred to recovery in stable condition.    PATIENT DISPOSITION:  To PACU, stable

## 2017-11-06 NOTE — Anesthesia Procedure Notes (Signed)
Procedure Name: Intubation Date/Time: 11/06/2017 1:33 AM Performed by: Eligha Bridegroom, CRNA Pre-anesthesia Checklist: Patient identified, Emergency Drugs available, Suction available, Patient being monitored and Timeout performed Patient Re-evaluated:Patient Re-evaluated prior to induction Oxygen Delivery Method: Circle system utilized Preoxygenation: Pre-oxygenation with 100% oxygen Induction Type: IV induction Laryngoscope Size: Mac and 4 Grade View: Grade I Tube type: Oral Tube size: 7.5 mm Number of attempts: 1 Airway Equipment and Method: Stylet Placement Confirmation: ETT inserted through vocal cords under direct vision,  positive ETCO2 and breath sounds checked- equal and bilateral Secured at: 22 cm Tube secured with: Tape Dental Injury: Teeth and Oropharynx as per pre-operative assessment

## 2017-11-07 ENCOUNTER — Other Ambulatory Visit: Payer: Self-pay

## 2017-11-07 ENCOUNTER — Encounter (HOSPITAL_COMMUNITY): Payer: Self-pay | Admitting: Otolaryngology

## 2017-11-07 ENCOUNTER — Ambulatory Visit: Payer: BLUE CROSS/BLUE SHIELD

## 2017-11-07 NOTE — Progress Notes (Signed)
Patient ID: Matthew Meyer., male   DOB: September 30, 1955, 62 y.o.   MRN: [id number] Subjective: No specific complaints.  Doing well overall.  Objective: Vital signs in last 24 hours: Temp:  [97.2 F (36.2 C)-98.6 F (37 C)] 97.8 F (36.6 C) (05/03 0413) Pulse Rate:  [52-90] 52 (05/03 0413) Resp:  [15-24] 18 (05/03 0413) BP: (113-160)/(34-76) 129/65 (05/03 0413) SpO2:  [96 %-100 %] 100 % (05/03 0413) Weight:  [97.7 kg (215 lb 6.2 oz)] 97.7 kg (215 lb 6.2 oz) (05/02 1823) Weight change:  Last BM Date: 11/05/17  Intake/Output from previous day: 05/02 0701 - 05/03 0700 In: 3741.3 [P.O.:360; I.V.:3281.3; IV Piggyback:100] Out: 1160 [Urine:1150; Blood:10] Intake/Output this shift: Total I/O In: 115 [P.O.:115] Out: -   PHYSICAL EXAM: Drain in place.  Minimal swelling.  Bolster in place.  Upper division facial nerve slightly weak, lower division near complete weakness.  Lab Results: No results for input(s): WBC, HGB, HCT, PLT in the last 72 hours. BMET No results for input(s): NA, K, CL, CO2, GLUCOSE, BUN, CREATININE, CALCIUM in the last 72 hours.  Studies/Results: Ct Chest W Contrast  Result Date: 11/06/2017 CLINICAL DATA:  Left parotid gland neoplasm with concern for potential metastatic disease EXAM: CT CHEST WITH CONTRAST TECHNIQUE: Multidetector CT imaging of the chest was performed during intravenous contrast administration. CONTRAST:  23mL OMNIPAQUE IOHEXOL 300 MG/ML  SOLN COMPARISON:  None. FINDINGS: Cardiovascular: There is no thoracic aortic aneurysm or dissection. The visualized great vessels appear normal. The left common carotid and right innominate arteries arise as a common trunk, an anatomic variant. There are multiple foci of coronary artery calcification. There is no pericardial effusion or pericardial thickening. No major vessel pulmonary embolus evident. Mediastinum/Nodes: Thyroid appears normal. There is no appreciable thoracic adenopathy. There are occasional  subcentimeter axillary lymph nodes bilaterally which do not meet size criteria for pathologic significance. There is a small hiatal hernia. Lungs/Pleura: There is slight bibasilar atelectasis. No edema or consolidation. No pulmonary mass or nodular lesion. No pleural effusion or pleural thickening evident. Upper Abdomen: Visualized upper abdominal structures appear unremarkable. Musculoskeletal: There is degenerative change in the thoracic spine with diffuse idiopathic skeletal hyperostosis. There are no blastic or lytic bone lesions. No chest wall lesions are evident. IMPRESSION: 1. No evident neoplastic focus in the thoracic region. No parenchymal lung mass. No adenopathy by size criteria. There are a few subcentimeter axillary lymph nodes which do not meet size criteria for pathologic significance. 2. No lung edema or consolidation. There is slight bibasilar atelectasis. 3.  Small hiatal hernia. 4.  Foci of coronary artery calcification noted. 5. 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 No blastic or lytic bone lesions. Electronically Signed   By: Lowella Grip III M.D.   On: 11/06/2017 11:19    Medications: I have reviewed the patient's current medications.  Assessment/Plan: Postop day 1, doing well.  Possible drain removal and discharge home tomorrow.  LOS: 1 day   [name] 11/07/2017, 10:12 AM

## 2017-11-08 MED ORDER — PROMETHAZINE HCL 25 MG RE SUPP: 25.0000 mg | Freq: Four times a day (QID) | RECTAL | 1 refills | Status: DC | PRN

## 2017-11-08 MED ORDER — HYDROCODONE-ACETAMINOPHEN 7.5-325 MG PO TABS: 1.0000 | ORAL_TABLET | Freq: Four times a day (QID) | ORAL | 0 refills | Status: DC | PRN

## 2017-11-08 NOTE — Discharge Instructions (Signed)
Okay to keep a dressing on the drain site for the next couple of days until there is no further drainage.  Keep everything clean and dry.  Keep the leg dressing in place.  Return to the office Tuesday to have the dressings removed.

## 2017-11-08 NOTE — Discharge Summary (Signed)
Physician Discharge Summary  Patient ID: Matthew Meyer. MRN: [id number] DOB/AGE: 61-Sep-1957 61 y.o.  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 rate 18, height 5\' 8"  (1.727 m), weight 97.7 kg (215 lb 6.2 oz), SpO2 98 %. PHYSICAL EXAM: Surgical site looks excellent.  Drain removed.  Bolster in place.  Disposition: Discharge disposition: 01-Home or Self Care       Discharge Instructions    Diet - low sodium heart healthy   Complete by:  As directed    Increase activity slowly   Complete by:  As directed       Follow-up Information    [name], MD. Schedule an appointment as soon as possible for a visit on 11/11/2017.   Specialty:  Otolaryngology Contact information: [address] [place] [address] [phone]           Signed: [name] 11/08/2017, 8:30 AM

## 2017-11-08 NOTE — Progress Notes (Signed)
Pt discharged home in stable condition after going over discharge instructions with no concerns voiced. AVS and discharge scripts given to pt before leaving

## 2017-11-10 ENCOUNTER — Encounter: Payer: BLUE CROSS/BLUE SHIELD | Admitting: Nutrition

## 2017-11-13 DIAGNOSIS — H903 Sensorineural hearing loss, bilateral: Secondary | ICD-10-CM | POA: Diagnosis not present

## 2017-11-14 ENCOUNTER — Ambulatory Visit (HOSPITAL_COMMUNITY): Payer: BLUE CROSS/BLUE SHIELD

## 2017-11-18 ENCOUNTER — Ambulatory Visit (HOSPITAL_COMMUNITY): Payer: Self-pay | Admitting: Dentistry

## 2017-11-18 ENCOUNTER — Encounter (HOSPITAL_COMMUNITY): Payer: Self-pay | Admitting: Dentistry

## 2017-11-18 VITALS — BP 139/77 | HR 67 | Temp 98.1°F

## 2017-11-18 DIAGNOSIS — C07 Malignant neoplasm of parotid gland: Secondary | ICD-10-CM

## 2017-11-18 DIAGNOSIS — Z01818 Encounter for other preprocedural examination: Secondary | ICD-10-CM

## 2017-11-18 DIAGNOSIS — K0530 Chronic periodontitis, unspecified: Secondary | ICD-10-CM

## 2017-11-18 DIAGNOSIS — K029 Dental caries, unspecified: Secondary | ICD-10-CM

## 2017-11-18 DIAGNOSIS — K045 Chronic apical periodontitis: Secondary | ICD-10-CM

## 2017-11-18 DIAGNOSIS — K036 Deposits [accretions] on teeth: Secondary | ICD-10-CM

## 2017-11-18 NOTE — Progress Notes (Signed)
PRE-OPERATIVE NOTE:  11/18/2017 Matthew Meyer. [id number]  VITALS: BP 139/77   Pulse 67   Temp 98.1 F (36.7 C)   Lab Results  Component Value Date   WBC 6.2 10/31/2017   HGB 11.8 (L) 10/31/2017   HCT 35.3 (L) 10/31/2017   MCV 86.8 10/31/2017   PLT 247 10/31/2017   BMET    Component Value Date/Time   NA 139 10/31/2017 0938   K 4.1 10/31/2017 0938   CL 102 10/31/2017 0938   CO2 28 10/31/2017 0938   GLUCOSE 116 10/31/2017 0938   BUN 15 10/31/2017 0938   CREATININE 1.15 10/31/2017 0938   CALCIUM 10.8 (H) 10/31/2017 0938   GFRNONAA >60 10/31/2017 0938   GFRAA >60 10/31/2017 0938    No results found for: INR, PROTIME No results found for: PTT   [name] presents for limited oral examination and discussion of plan of care for dental procedures in the operating room with general anesthesia. Patient is status post left parotidectomy and neck dissection on 11/06/2017 with Dr. Constance Holster.  SUBJECTIVE: The patient denies any acute dental changes and agrees to proceed with treatment as planned. Patient denies having any significant limited opening at this time. Patient is agreement that he wishes to proceed with dental procedures in the operating with general anesthesia for this coming Thursday, 11/19/2017 at 7:30 AM.  EXAM: No sign of acute dental changes.  ASSESSMENT: Patient is affected by chronic apical periodontitis, dental caries, chronic periodontitis, and accretions.  PLAN: Patient agrees to proceed with treatment as planned in the operating room as previously discussed and accepts the risks, benefits, and complications of the proposed treatment. Patient is aware of the risk for bleeding, bruising, swelling, infection, pain, nerve damage,soft tissue damage, damage to adjacent teeth, sinus involvement, root tip fracture, mandible fracture, and the risks of complications associated with the anesthesia. Patient also is aware of the potential for other  complications not mentioned above.   Lenn Cal, DDS

## 2017-11-18 NOTE — Patient Instructions (Signed)

## 2017-11-19 ENCOUNTER — Encounter (HOSPITAL_COMMUNITY)
Admission: RE | Admit: 2017-11-19 | Discharge: 2017-11-19 | Disposition: A | Discharge status: Home / Self Care | Payer: BLUE CROSS/BLUE SHIELD | Source: Ambulatory Visit | Attending: Dentistry | Admitting: Dentistry

## 2017-11-19 ENCOUNTER — Encounter (HOSPITAL_COMMUNITY): Payer: Self-pay | Admitting: *Deleted

## 2017-11-19 ENCOUNTER — Other Ambulatory Visit: Payer: Self-pay

## 2017-11-19 DIAGNOSIS — K045 Chronic apical periodontitis: Secondary | ICD-10-CM | POA: Diagnosis not present

## 2017-11-19 DIAGNOSIS — K036 Deposits [accretions] on teeth: Secondary | ICD-10-CM | POA: Diagnosis not present

## 2017-11-19 DIAGNOSIS — C07 Malignant neoplasm of parotid gland: Secondary | ICD-10-CM | POA: Insufficient documentation

## 2017-11-19 DIAGNOSIS — Z01812 Encounter for preprocedural laboratory examination: Secondary | ICD-10-CM | POA: Insufficient documentation

## 2017-11-19 DIAGNOSIS — K029 Dental caries, unspecified: Secondary | ICD-10-CM | POA: Diagnosis not present

## 2017-11-19 LAB — CBC
HEMATOCRIT: 36.7 % — AB (ref 39.0–52.0)
HEMOGLOBIN: 12.2 g/dL — AB (ref 13.0–17.0)
MCH: 28.2 pg (ref 26.0–34.0)
MCHC: 33.2 g/dL (ref 30.0–36.0)
MCV: 85 fL (ref 78.0–100.0)
Platelets: 341 10*3/uL (ref 150–400)
RBC: 4.32 MIL/uL (ref 4.22–5.81)
RDW: 13.2 % (ref 11.5–15.5)
WBC: 6 10*3/uL (ref 4.0–10.5)

## 2017-11-19 LAB — BASIC METABOLIC PANEL
ANION GAP: 11 (ref 5–15)
BUN: 15 mg/dL (ref 6–20)
CALCIUM: 9.9 mg/dL (ref 8.9–10.3)
CO2: 26 mmol/L (ref 22–32)
Chloride: 103 mmol/L (ref 101–111)
Creatinine, Ser: 1.1 mg/dL (ref 0.61–1.24)
Glucose, Bld: 113 mg/dL — ABNORMAL HIGH (ref 65–99)
POTASSIUM: 4.2 mmol/L (ref 3.5–5.1)
SODIUM: 140 mmol/L (ref 135–145)

## 2017-11-20 ENCOUNTER — Encounter (HOSPITAL_COMMUNITY): Payer: Self-pay

## 2017-11-20 ENCOUNTER — Encounter (HOSPITAL_COMMUNITY)
Admission: RE | Disposition: A | Discharge status: Home / Self Care | Payer: Self-pay | Source: Ambulatory Visit | Attending: Dentistry

## 2017-11-20 ENCOUNTER — Ambulatory Visit (HOSPITAL_COMMUNITY): Payer: BLUE CROSS/BLUE SHIELD | Admitting: Anesthesiology

## 2017-11-20 ENCOUNTER — Ambulatory Visit (HOSPITAL_COMMUNITY)
Admission: RE | Admit: 2017-11-20 | Discharge: 2017-11-20 | Disposition: A | Discharge status: Home / Self Care | Payer: BLUE CROSS/BLUE SHIELD | Source: Ambulatory Visit | Attending: Dentistry | Admitting: Dentistry

## 2017-11-20 DIAGNOSIS — K029 Dental caries, unspecified: Secondary | ICD-10-CM | POA: Insufficient documentation

## 2017-11-20 DIAGNOSIS — Z01818 Encounter for other preprocedural examination: Secondary | ICD-10-CM

## 2017-11-20 DIAGNOSIS — K045 Chronic apical periodontitis: Secondary | ICD-10-CM | POA: Diagnosis not present

## 2017-11-20 DIAGNOSIS — C07 Malignant neoplasm of parotid gland: Secondary | ICD-10-CM | POA: Diagnosis not present

## 2017-11-20 DIAGNOSIS — I10 Essential (primary) hypertension: Secondary | ICD-10-CM | POA: Diagnosis not present

## 2017-11-20 DIAGNOSIS — K0530 Chronic periodontitis, unspecified: Secondary | ICD-10-CM

## 2017-11-20 DIAGNOSIS — K036 Deposits [accretions] on teeth: Secondary | ICD-10-CM | POA: Diagnosis not present

## 2017-11-20 HISTORY — PX: MULTIPLE EXTRACTIONS WITH ALVEOLOPLASTY: SHX5342

## 2017-11-20 HISTORY — DX: Malignant (primary) neoplasm, unspecified: C80.1

## 2017-11-20 SURGERY — MULTIPLE EXTRACTION WITH ALVEOLOPLASTY
Anesthesia: General

## 2017-11-20 MED ORDER — PROPOFOL 10 MG/ML IV BOLUS
INTRAVENOUS | Status: DC | PRN
  Administered 2017-11-20: 200 mg via INTRAVENOUS

## 2017-11-20 MED ORDER — OXYCODONE HCL 5 MG/5ML PO SOLN
5.0000 mg | Freq: Once | ORAL | Status: DC | PRN
  Filled 2017-11-20: qty 5

## 2017-11-20 MED ORDER — ISOPROPYL ALCOHOL 70 % SOLN
Status: DC | PRN
  Administered 2017-11-20: 1 via TOPICAL

## 2017-11-20 MED ORDER — DEXAMETHASONE SODIUM PHOSPHATE 10 MG/ML IJ SOLN
INTRAMUSCULAR | Status: DC | PRN
  Administered 2017-11-20: 10 mg via INTRAVENOUS

## 2017-11-20 MED ORDER — BUPIVACAINE-EPINEPHRINE (PF) 0.5% -1:200000 IJ SOLN
INTRAMUSCULAR | Status: AC
  Filled 2017-11-20: qty 3.6

## 2017-11-20 MED ORDER — LACTATED RINGERS IV SOLN: INTRAVENOUS | Status: DC | PRN

## 2017-11-20 MED ORDER — PHENYLEPHRINE HCL 10 MG/ML IJ SOLN
INTRAVENOUS | Status: DC | PRN
  Administered 2017-11-20: 40 ug/min via INTRAVENOUS

## 2017-11-20 MED ORDER — FENTANYL CITRATE (PF) 250 MCG/5ML IJ SOLN
INTRAMUSCULAR | Status: AC
  Filled 2017-11-20: qty 5

## 2017-11-20 MED ORDER — OXYCODONE HCL 5 MG PO TABS: 5.0000 mg | ORAL_TABLET | Freq: Once | ORAL | Status: DC | PRN

## 2017-11-20 MED ORDER — OXYCODONE HCL 5 MG PO TABS: 5.0000 mg | ORAL_TABLET | ORAL | Status: DC | PRN

## 2017-11-20 MED ORDER — FENTANYL CITRATE (PF) 100 MCG/2ML IJ SOLN: 25.0000 ug | INTRAMUSCULAR | Status: DC | PRN

## 2017-11-20 MED ORDER — FENTANYL CITRATE (PF) 100 MCG/2ML IJ SOLN
INTRAMUSCULAR | Status: DC | PRN
  Administered 2017-11-20: 100 ug via INTRAVENOUS
  Administered 2017-11-20: 50 ug via INTRAVENOUS

## 2017-11-20 MED ORDER — LIDOCAINE 2% (20 MG/ML) 5 ML SYRINGE
INTRAMUSCULAR | Status: AC
  Filled 2017-11-20: qty 5

## 2017-11-20 MED ORDER — LACTATED RINGERS IV SOLN
INTRAVENOUS | Status: DC
  Administered 2017-11-20 (×2): via INTRAVENOUS

## 2017-11-20 MED ORDER — PHENYLEPHRINE 40 MCG/ML (10ML) SYRINGE FOR IV PUSH (FOR BLOOD PRESSURE SUPPORT)
PREFILLED_SYRINGE | INTRAVENOUS | Status: AC
  Filled 2017-11-20: qty 10

## 2017-11-20 MED ORDER — LIDOCAINE 2% (20 MG/ML) 5 ML SYRINGE
INTRAMUSCULAR | Status: DC | PRN
  Administered 2017-11-20: 75 mg via INTRAVENOUS
  Administered 2017-11-20: 25 mg via INTRAVENOUS

## 2017-11-20 MED ORDER — ROCURONIUM BROMIDE 10 MG/ML (PF) SYRINGE
PREFILLED_SYRINGE | INTRAVENOUS | Status: AC
  Filled 2017-11-20: qty 5

## 2017-11-20 MED ORDER — MIDAZOLAM HCL 5 MG/5ML IJ SOLN
INTRAMUSCULAR | Status: DC | PRN
  Administered 2017-11-20: 2 mg via INTRAVENOUS

## 2017-11-20 MED ORDER — ONDANSETRON HCL 4 MG/2ML IJ SOLN
INTRAMUSCULAR | Status: DC | PRN
  Administered 2017-11-20: 4 mg via INTRAVENOUS

## 2017-11-20 MED ORDER — OXYMETAZOLINE HCL 0.05 % NA SOLN
NASAL | Status: AC
  Filled 2017-11-20: qty 15

## 2017-11-20 MED ORDER — DEXAMETHASONE SODIUM PHOSPHATE 10 MG/ML IJ SOLN
INTRAMUSCULAR | Status: AC
  Filled 2017-11-20: qty 1

## 2017-11-20 MED ORDER — LIDOCAINE-EPINEPHRINE 2 %-1:100000 IJ SOLN
INTRAMUSCULAR | Status: DC | PRN
  Administered 2017-11-20: 1.7 mL via INTRADERMAL

## 2017-11-20 MED ORDER — ONDANSETRON HCL 4 MG/2ML IJ SOLN: 4.0000 mg | Freq: Once | INTRAMUSCULAR | Status: DC | PRN

## 2017-11-20 MED ORDER — 0.9 % SODIUM CHLORIDE (POUR BTL) OPTIME
TOPICAL | Status: DC | PRN
  Administered 2017-11-20: 1000 mL

## 2017-11-20 MED ORDER — MIDAZOLAM HCL 2 MG/2ML IJ SOLN
INTRAMUSCULAR | Status: AC
  Filled 2017-11-20: qty 2

## 2017-11-20 MED ORDER — LIDOCAINE-EPINEPHRINE 2 %-1:100000 IJ SOLN
INTRAMUSCULAR | Status: AC
  Filled 2017-11-20: qty 10.2

## 2017-11-20 MED ORDER — ISOPROPYL ALCOHOL 70 % SOLN
Status: AC
  Filled 2017-11-20: qty 480

## 2017-11-20 MED ORDER — ONDANSETRON HCL 4 MG/2ML IJ SOLN
INTRAMUSCULAR | Status: AC
  Filled 2017-11-20: qty 2

## 2017-11-20 MED ORDER — PROPOFOL 10 MG/ML IV BOLUS
INTRAVENOUS | Status: AC
  Filled 2017-11-20: qty 40

## 2017-11-20 MED ORDER — CEFAZOLIN SODIUM-DEXTROSE 2-4 GM/100ML-% IV SOLN
2.0000 g | Freq: Once | INTRAVENOUS | Status: AC
  Administered 2017-11-20: 2 g via INTRAVENOUS
  Filled 2017-11-20: qty 100

## 2017-11-20 MED ORDER — ROCURONIUM BROMIDE 10 MG/ML (PF) SYRINGE
PREFILLED_SYRINGE | INTRAVENOUS | Status: DC | PRN
  Administered 2017-11-20: 70 mg via INTRAVENOUS

## 2017-11-20 MED ORDER — PHENYLEPHRINE HCL 10 MG/ML IJ SOLN
INTRAMUSCULAR | Status: DC | PRN
  Administered 2017-11-20 (×2): 80 ug via INTRAVENOUS

## 2017-11-20 MED ORDER — STERILE WATER FOR IRRIGATION IR SOLN
Status: DC | PRN
  Administered 2017-11-20: 1000 mL

## 2017-11-20 MED ORDER — LIDOCAINE HCL URETHRAL/MUCOSAL 2 % EX GEL
CUTANEOUS | Status: AC
  Filled 2017-11-20: qty 5

## 2017-11-20 MED ORDER — LACTATED RINGERS IV SOLN: INTRAVENOUS | Status: DC

## 2017-11-20 MED ORDER — SUGAMMADEX SODIUM 200 MG/2ML IV SOLN
INTRAVENOUS | Status: DC | PRN
  Administered 2017-11-20: 200 mg via INTRAVENOUS

## 2017-11-20 MED ORDER — CEFAZOLIN SODIUM-DEXTROSE 2-4 GM/100ML-% IV SOLN
INTRAVENOUS | Status: AC
  Filled 2017-11-20: qty 100

## 2017-11-20 SURGICAL SUPPLY — 31 items
ATTRACTOMAT 16X20 MAGNETIC DRP (DRAPES) ×2 IMPLANT
BAG ZIPLOCK 12X15 (MISCELLANEOUS) IMPLANT
BANDAGE EYE OVAL (MISCELLANEOUS) ×4 IMPLANT
BLADE SURG 15 STRL LF DISP TIS (BLADE) ×2 IMPLANT
BLADE SURG 15 STRL SS (BLADE) ×2
CANNULA VESSEL W/WING WO/VALVE (CANNULA) ×2 IMPLANT
COVER SURGICAL LIGHT HANDLE (MISCELLANEOUS) ×2 IMPLANT
GAUZE 4X4 16PLY RFD (DISPOSABLE) ×2 IMPLANT
GAUZE SPONGE 4X4 12PLY STRL (GAUZE/BANDAGES/DRESSINGS) ×2 IMPLANT
GLOVE BIOGEL PI IND STRL 6 (GLOVE) ×1 IMPLANT
GLOVE BIOGEL PI INDICATOR 6 (GLOVE) ×1
GLOVE SURG ORTHO 8.0 STRL STRW (GLOVE) ×2 IMPLANT
GLOVE SURG SS PI 6.0 STRL IVOR (GLOVE) ×2 IMPLANT
GOWN STRL REUS W/TWL 2XL LVL3 (GOWN DISPOSABLE) ×2 IMPLANT
GOWN STRL REUS W/TWL LRG LVL3 (GOWN DISPOSABLE) ×2 IMPLANT
KIT BASIN OR (CUSTOM PROCEDURE TRAY) ×2 IMPLANT
NEEDLE DENTAL RB 25GX1.25 (NEEDLE) IMPLANT
NS IRRIG 1000ML POUR BTL (IV SOLUTION) ×2 IMPLANT
PACK EENT SPLIT (PACKS) ×2 IMPLANT
PACKING VAGINAL (PACKING) ×2 IMPLANT
SPONGE SURGIFOAM ABS GEL 12-7 (HEMOSTASIS) IMPLANT
SUCTION FRAZIER HANDLE 12FR (TUBING)
SUCTION TUBE FRAZIER 12FR DISP (TUBING) IMPLANT
SUT CHROMIC 3 0 PS 2 (SUTURE) ×6 IMPLANT
SUT CHROMIC 4 0 P 3 18 (SUTURE) IMPLANT
SYR 50ML LL SCALE MARK (SYRINGE) ×2 IMPLANT
TOWEL OR 17X26 10 PK STRL BLUE (TOWEL DISPOSABLE) ×2 IMPLANT
TUBING CONNECTING 10 (TUBING) ×2 IMPLANT
WATER STERILE IRR 1000ML POUR (IV SOLUTION) ×2 IMPLANT
WATER TABLETS ICX (MISCELLANEOUS) ×2 IMPLANT
YANKAUER SUCT BULB TIP NO VENT (SUCTIONS) ×2 IMPLANT

## 2017-11-20 NOTE — H&P (Signed)
11/20/2017  Patient:            Matthew Meyer. Date of Birth:  Mar 02, 1956 MRN:                [id number]   BP (!) 133/91   Pulse (!) 58   Temp 98.3 F (36.8 C) (Oral)   Resp 18   Ht 5\' 8"  (1.727 m)   Wt 201 lb 2 oz (91.2 kg)   SpO2 100%   BMI 30.58 kg/m    Matthew Ip Joeangel Jeanpaul. 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 Constance Holster. Patient with anticipated postoperative radiation therapy. Patient is now seen for multiple dental extractions with alveoloplasty and gross debridement of remaining dentition the operating with general anesthesia. Patient denies any acute or call or dental changes. Please see H&P note from Dr. Constance Holster dated 11/05/2017 to use as H&P for the dental operating room procedure.  Lenn Cal, DDS  HPI:   Matthew Meyer is a 62 y.o. male who presents as a consult Patient.   Referring Provider: Carmelia Roller, ANP  Chief complaint: Facial swelling.  HPI: He developed severe left facial swelling about 3 weeks ago. It does not hurt. It seems to get more tight when he eats. He had something like this about 10 years ago that was treated with medication and resolved. No history of skin cancer.  PMH/Meds/All/SocHx/FamHx/ROS:   Past Medical History:  Diagnosis Date  . Acid reflux  . Hypertension   Past Surgical History:  Procedure Laterality Date  . ANKLE SURGERY  . APPENDECTOMY   No family history of bleeding disorders, wound healing problems or difficulty with anesthesia.   Social History   Social History  . Marital status: Single  Spouse name: N/A  . Number of children: N/A  . Years of education: N/A   Occupational History  . Not on file.   Social History Main Topics  . Smoking status: Never Smoker  . Smokeless tobacco: Never Used  . Alcohol use Not on file  . Drug use: Unknown  . Sexual activity: Not on file   Other Topics Concern  . Not on file   Social History Narrative  .  No narrative on file   Current Outpatient Prescriptions:  . atenolol (TENORMIN) 50 MG tablet, take 1 tablet by mouth daily, Disp: , Rfl:  . lisinopril-hydrochlorothiazide (PRINZIDE,ZESTORETIC) 20-25 mg per tablet, TAKE ONE TABLET BY MOUTH ONE TIME DAILY , Disp: , Rfl:  . pantoprazole (PROTONIX) 40 MG tablet, TAKE ONE TABLET BY MOUTH ONE TIME DAILY , Disp: , Rfl:   A complete ROS was performed with pertinent positives/negatives noted in the HPI. The remainder of the ROS are negative.   Physical Exam:   Ht 1.702 m (5\' 7" )  Wt 98.9 kg (218 lb)  BMI 34.14 kg/m   General: Healthy and alert, in no distress, breathing easily. Normal affect. In a pleasant mood. Head: Normocephalic, atraumatic. No masses, or scars. Eyes: Pupils are equal, and reactive to light. Vision is grossly intact. No spontaneous or gaze nystagmus. Ears: Ear canals are clear. Tympanic membranes are intact, with normal landmarks and the middle ears are clear and healthy. Hearing: Grossly normal. Nose: Nasal cavities are clear with healthy mucosa, no polyps or exudate. Airways are patent. Face: 6 cm nonfluctuant left parotid mass. Oral Cavity: No mucosal abnormalities are noted. Tongue with normal mobility. Dentition appears healthy. Clear saliva expressible from the left parotid  duct. Oropharynx: Tonsils are symmetric. There are no mucosal masses identified. Tongue base appears normal and healthy. Larynx/Hypopharynx: deferred Chest: Deferred Neck: No palpable masses, no cervical adenopathy, no thyroid nodules or enlargement. Neuro: Cranial nerves II-XII with normal function. Balance: Normal gate. Other findings: none.  Independent Review of Additional Tests or Records:  none  Procedures:  none  Impression & Plans:  Left parotid mass. Suspicious for neoplasm based on the lack of tenderness and fluctuance. Recommend CT evaluation. We will discuss results when available.              Electronically  signed by [name], MD at 11/05/2017 10:13 AM

## 2017-11-20 NOTE — Anesthesia Postprocedure Evaluation (Signed)
Anesthesia Post Note  Patient: Matthew Meyer.  Procedure(s) Performed: Extaction of tooth #'s 14 and 15 with alveoloplasty and gross debridement of remaining teeth (N/A )     Patient location during evaluation: PACU Anesthesia Type: General Level of consciousness: awake and alert Pain management: pain level controlled Vital Signs Assessment: post-procedure vital signs reviewed and stable Respiratory status: spontaneous breathing, nonlabored ventilation, respiratory function stable and patient connected to nasal cannula oxygen Cardiovascular status: blood pressure returned to baseline and stable Postop Assessment: no apparent nausea or vomiting Anesthetic complications: no    Last Vitals:  Vitals:   11/20/17 0930 11/20/17 0943  BP: 128/72 123/72  Pulse: 61   Resp: 19 14  Temp:  (!) 36.2 C  SpO2: 98%     Last Pain:  Vitals:   11/20/17 0930  TempSrc:   PainSc: 0-No pain                 Jasmin Trumbull COKER

## 2017-11-20 NOTE — Discharge Instructions (Signed)

## 2017-11-20 NOTE — Op Note (Signed)
OPERATIVE REPORT  Patient:            Matthew Meyer. Date of Birth:  04-May-1956 MRN:                [id number]   DATE OF PROCEDURE:  11/20/2017  PREOPERATIVE DIAGNOSES: 1.  Cancer left parotid gland 2.  Pre-radiation therapy dental protocol 3.  Chronic apical periodontitis 4.  Dental caries 5.  Chronic periodontitis 6.  Accretions  POSTOPERATIVE DIAGNOSES: 1.  Cancer left parotid gland 2.  Pre-radiation therapy dental protocol 3.  Chronic apical periodontitis 4.  Dental caries 5.  Chronic periodontitis 6.  Accretions  OPERATIONS: 1. Multiple extraction of tooth numbers 14 and 15 with alveoloplasty 2. Gross debridement of remaining dentition   SURGEON: Lenn Cal, DDS  ASSISTANT: Molli Posey (dental assistant)  ANESTHESIA: General anesthesia via nasoendotracheal tube.  MEDICATIONS: 1. Ancef 2 g IV prior to invasive dental procedures. 2. Local anesthesia with a total utilization of 4 carpules each containing 34 mg of lidocaine with 0.017 mg of epinephrine  SPECIMENS: There are two teeth that were discarded.  DRAINS: None  CULTURES: None  COMPLICATIONS: None  ESTIMATED BLOOD LOSS: 50 mLs.  INTRAVENOUS FLUIDS: 1000 mLs of Lactated ringers solution.  INDICATIONS: The patient was recently diagnosed with cancer of the left parotid gland.  A medically necessary dental consultation was then requested to evaluate poor dentition as part of a pre-radiation therapy dental protocol examination.  The patient was examined and treatment planned for extraction of tooth numbers 14 and 15 with alveoloplasty and gross debridement of remaining dentition in the operating room with general anesthesia.  This treatment plan was formulated to decrease the risks and complications associated with dental infection from affecting the patient's systemic health and to prevent future complications such as osteoradionecrosis.  OPERATIVE FINDINGS: Patient was examined  operating room number 11.  The teeth were identified for extraction. The patient was noted be affected by chronic periodontitis, accretions, dental caries, and chronic apical periodontitis.   DESCRIPTION OF PROCEDURE: Patient was brought to the main operating room number 11. Patient was then placed in the supine position on the operating table. General anesthesia was then induced per the anesthesia team. The patient was then prepped and draped in the usual manner for dental medicine procedure. A timeout was performed. The patient was identified and procedures were verified. A throat pack was placed at this time. The oral cavity was then thoroughly examined with the findings noted above. The patient was then ready for dental medicine procedure as follows:  Local anesthesia was then administered sequentially with a total utilization of 4 carpules each containing 34 mg of lidocaine with 0.017 mg of epinephrine.  The Maxillary left quadrant was first approached. Anesthesia was then delivered utilizing infiltration with lidocaine with epinephrine. A #15 blade incision was then made from the maxillary tuberosity and extended to the mesial of #13.  A  surgical flap was then carefully reflected. The teeth were then subluxated with a series of straight elevators.  A surgical handpiece and bur and copious amounts sterile water was then used to remove buccal and interseptal bone as needed.  The coronal aspect of tooth numbers 14 and 15 were then removed with a 53L forceps.  The roots of tooth numbers 14 and 15 were then subluxated and removed with a 151 forceps.  Alveoloplasty was then performed utilizing a ronguers and bone file. The surgical site was then irrigated with copious amounts of sterile  saline. The tissues were approximated and trimmed appropriately.  A piece of Surgifoam was placed the extraction sockets appropriately.  The surgical site was then closed from the maxillary left tuberosity and extended the  distal #13 utilizing 3-0 chromic gut suture in a continuous interrupted suture technique x1.  At this point in time, a gross debridement procedure was performed.  A sonic scaler was used to remove accretions.  A series of hand curettes were then used to further remove accretions.  The Sonic scaler was then again used to refine the removal of Accretions.  At this point in time, the entire mouth was irrigated with copious amounts of sterile saline. The patient was examined for complications, seeing none, the dental medicine procedure was deemed to be complete. The throat pack was removed at this time. An oral airway was then placed at the request of the anesthesia team. A series of 4 x 4 gauze were placed in the mouth to aid hemostasis. The patient was then handed over to the anesthesia team for final disposition. After an appropriate amount of time, the patient was extubated and taken to the postanesthsia care unit in good condition. All counts were correct for the dental medicine procedure.   Lenn Cal, DDS.

## 2017-11-20 NOTE — Anesthesia Procedure Notes (Signed)
Procedure Name: Intubation Date/Time: 11/20/2017 7:39 AM Performed by: Lissa Morales, CRNA Pre-anesthesia Checklist: Patient identified, Emergency Drugs available, Suction available and Patient being monitored Patient Re-evaluated:Patient Re-evaluated prior to induction Oxygen Delivery Method: Circle system utilized Preoxygenation: Pre-oxygenation with 100% oxygen Induction Type: IV induction Ventilation: Mask ventilation without difficulty Laryngoscope Size: Mac and 4 Grade View: Grade II Nasal Tubes: Nasal prep performed and Nasal Rae Tube size: 7.5 mm Number of attempts: 2 Airway Equipment and Method: Oral airway and Bougie stylet Placement Confirmation: ETT inserted through vocal cords under direct vision,  positive ETCO2 and breath sounds checked- equal and bilateral Secured at: 29 (tip of right nare) cm Tube secured with: Tape Dental Injury: Teeth and Oropharynx as per pre-operative assessment  Comments: Pt prepped with afrin x2 sprays bilateral nares. After GA, nasal pharyngeal  Trumpets  #6,7,8 passed easily via right nare. 7.5 nasal rae passed easily into posterior oralpharynx. Able to visualize cords but not able to pass ETT .Bougie passed through ETT and ETT passed easily thru cords. Bilateral breath sounds equal, +ETCO2.

## 2017-11-20 NOTE — Anesthesia Preprocedure Evaluation (Signed)
Anesthesia Evaluation  Patient identified by MRN, date of birth, ID band Patient awake    Reviewed: Allergy & Precautions, NPO status , Patient's Chart, lab work & pertinent test results  Airway Mallampati: II  TM Distance: >3 FB Neck ROM: Full    Dental  (+) Teeth Intact, Dental Advisory Given   Pulmonary    breath sounds clear to auscultation       Cardiovascular hypertension,  Rhythm:Regular Rate:Normal     Neuro/Psych    GI/Hepatic   Endo/Other    Renal/GU      Musculoskeletal   Abdominal   Peds  Hematology   Anesthesia Other Findings   Reproductive/Obstetrics                             Anesthesia Physical Anesthesia Plan  ASA: III  Anesthesia Plan: General   Post-op Pain Management:    Induction: Intravenous  PONV Risk Score and Plan: Ondansetron and Dexamethasone  Airway Management Planned: Nasal ETT  Additional Equipment:   Intra-op Plan:   Post-operative Plan: Extubation in OR  Informed Consent: I have reviewed the patients History and Physical, chart, labs and discussed the procedure including the risks, benefits and alternatives for the proposed anesthesia with the patient or authorized representative who has indicated his/her understanding and acceptance.     Plan Discussed with: CRNA and Anesthesiologist  Anesthesia Plan Comments:         Anesthesia Quick Evaluation

## 2017-11-20 NOTE — Transfer of Care (Signed)
Immediate Anesthesia Transfer of Care Note  Patient: Matthew Meyer.  Procedure(s) Performed: Extaction of tooth #'s 14 and 15 with alveoloplasty and gross debridement of remaining teeth (N/A )  Patient Location: PACU  Anesthesia Type:General  Level of Consciousness: awake, alert , oriented and patient cooperative  Airway & Oxygen Therapy: Patient Spontanous Breathing and Patient connected to face mask oxygen  Post-op Assessment: Report given to RN, Post -op Vital signs reviewed and stable and Patient moving all extremities X 4  Post vital signs: stable  Last Vitals:  Vitals Value Taken Time  BP 136/74 11/20/2017  9:00 AM  Temp 36.4 C 11/20/2017  9:00 AM  Pulse 62 11/20/2017  9:05 AM  Resp 11 11/20/2017  9:05 AM  SpO2 100 % 11/20/2017  9:05 AM  Vitals shown include unvalidated device data.  Last Pain:  Vitals:   11/20/17 0900  TempSrc:   PainSc: 0-No pain         Complications: No apparent anesthesia complications

## 2017-11-20 NOTE — Progress Notes (Signed)
PRE-OPERATIVE NOTE:  11/20/2017 Matthew Meyer. [id number]  VITALS: BP (!) 133/91   Pulse (!) 58   Temp 98.3 F (36.8 C) (Oral)   Resp 18   Ht 5\' 8"  (1.727 m)   Wt 201 lb 2 oz (91.2 kg)   SpO2 100%   BMI 30.58 kg/m   Lab Results  Component Value Date   WBC 6.0 11/19/2017   HGB 12.2 (L) 11/19/2017   HCT 36.7 (L) 11/19/2017   MCV 85.0 11/19/2017   PLT 341 11/19/2017   BMET    Component Value Date/Time   NA 140 11/19/2017 1425   K 4.2 11/19/2017 1425   CL 103 11/19/2017 1425   CO2 26 11/19/2017 1425   GLUCOSE 113 (H) 11/19/2017 1425   BUN 15 11/19/2017 1425   CREATININE 1.10 11/19/2017 1425   CREATININE 1.15 10/31/2017 0938   CALCIUM 9.9 11/19/2017 1425   GFRNONAA >60 11/19/2017 1425   GFRNONAA >60 10/31/2017 0938   GFRAA >60 11/19/2017 1425   GFRAA >60 10/31/2017 0938    No results found for: INR, PROTIME No results found for: PTT   [name] presents for multiple dental extractions with alveoloplasty and gross debridement remaining dentition the operating room with general anesthesia.  SUBJECTIVE: The patient denies any acute medical or dental changes and agrees to proceed with treatment as planned.  EXAM: No sign of acute dental changes.  ASSESSMENT: Patient is affected by chronic apical periodontitis, dental caries, chronic periodontitis, and accretions.  PLAN: Patient agrees to proceed with treatment as planned in the operating room as previously discussed and accepts the risks, benefits, and complications of the proposed treatment. Patient is aware of the risk for bleeding, bruising, swelling, infection, pain, nerve damage, soft tissue damage, damage to adjacent teeth, sinus involvement, root tip fracture, mandible fracture, and the risks of complications associated with the anesthesia. Patient also is aware of the potential for other complications not mentioned above.   Lenn Cal, DDS

## 2017-11-25 NOTE — Progress Notes (Signed)
Has armband been applied?  Yes  Does patient have an allergy to IV contrast dye?: No  Has patient ever received premedication for IV contrast dye?: N/A  Does patient take metformin?: No  If patient does take metformin when was the last dose: N/A  Date of lab work: 11/19/17 BUN: 15 CR: 1.10 EGFR: >60  IV site: R AC  Has IV site been added to flowsheet?  Yes  BP 118/73 (BP Location: Left Arm, Patient Position: Sitting, Cuff Size: Normal)   Pulse (!) 59   Temp 98.6 F (37 C) (Oral)   Resp 20   Ht _0  (1.727 m)   Wt 202 lb 3.2 oz (91.7 kg)   SpO2 100%   BMI 30.74 kg/m

## 2017-11-26 ENCOUNTER — Telehealth: Payer: Self-pay | Admitting: *Deleted

## 2017-11-26 NOTE — Telephone Encounter (Signed)
Oncology Nurse Navigator Documentation  Called Matthew Meyer to check on his well-being s/p 5/2 parotidectomy and 5/16 extractions.  He reported recovering well, denied concerns. I discussed next Tuesday's H&N MDC, he indicated availability, I provided 8:00 arrival, explained registration procedure. Confirmed his understanding of 5/29 11:00 with Dr. Enrique Sack, informed him of same day 1:15 IV Start 2:00 CT SIM appt.   I encouraged him to call me with questions/concerns prior to next week's appts.  He voiced understanding.  Gayleen Orem, RN, BSN Head & Neck Oncology Nurse [facility] [phone]

## 2017-12-02 ENCOUNTER — Ambulatory Visit: Payer: BLUE CROSS/BLUE SHIELD | Admitting: Nutrition

## 2017-12-02 ENCOUNTER — Ambulatory Visit: Payer: BLUE CROSS/BLUE SHIELD

## 2017-12-02 ENCOUNTER — Encounter: Payer: Self-pay | Admitting: Radiation Oncology

## 2017-12-02 ENCOUNTER — Other Ambulatory Visit: Payer: Self-pay

## 2017-12-02 ENCOUNTER — Ambulatory Visit
Admission: RE | Admit: 2017-12-02 | Discharge: 2017-12-02 | Disposition: A | Discharge status: Home / Self Care | Payer: BLUE CROSS/BLUE SHIELD | Source: Ambulatory Visit | Attending: Radiation Oncology | Admitting: Radiation Oncology

## 2017-12-02 ENCOUNTER — Ambulatory Visit: Payer: BLUE CROSS/BLUE SHIELD | Attending: Radiation Oncology | Admitting: Physical Therapy

## 2017-12-02 ENCOUNTER — Encounter: Payer: Self-pay | Admitting: *Deleted

## 2017-12-02 VITALS — BP 136/81 | HR 59 | Temp 97.9°F | Resp 18 | Wt 201.6 lb

## 2017-12-02 DIAGNOSIS — R293 Abnormal posture: Secondary | ICD-10-CM

## 2017-12-02 DIAGNOSIS — Z483 Aftercare following surgery for neoplasm: Secondary | ICD-10-CM | POA: Diagnosis not present

## 2017-12-02 DIAGNOSIS — C07 Malignant neoplasm of parotid gland: Secondary | ICD-10-CM | POA: Insufficient documentation

## 2017-12-02 DIAGNOSIS — Z9189 Other specified personal risk factors, not elsewhere classified: Secondary | ICD-10-CM

## 2017-12-02 NOTE — Progress Notes (Signed)
Financial Counselor--Spoke with patient--answered questions he had about his insurance and told him to bring in his income verification if he wants to apply for [employer]

## 2017-12-02 NOTE — Progress Notes (Signed)
Patient was seen during head and neck clinic for left parotid cancer. He is a 62 year old male with a past history of hypertension and GERD.  Medications include calcium, vitamin D, multivitamin, Protonix, Phenergan, and vitamin E.  Labs include glucose 113.  Height: 5 feet 8 inches. Weight: 201.6 pounds. Usual body weight: 208 pounds per patient before surgery. BMI: 30.65.  Nutrition diagnosis: Predicted suboptimal energy intake related to left parotid cancer as evidenced by history of presence of a condition for which research shows an increased incidence of suboptimal energy intake.  Intervention: Educated patient to consume higher calorie higher protein foods and small frequent meals and snacks. Provided patient with fact sheets on increasing calories and protein in a list of high-protein foods. Encourage patient to follow a bowel regimen to avoid constipation. Recommended adequate hydration throughout treatment. Questions were answered teach back method used.  Monitoring, evaluation, goals: Patient will tolerate adequate calories and protein to minimize weight loss.  Next visit: To be scheduled.  **Disclaimer: This note was dictated with voice recognition software. Similar sounding words can inadvertently be transcribed and this note may contain transcription errors which may not have been corrected upon publication of note.**

## 2017-12-02 NOTE — Therapy (Signed)
[place], [place], [address] Phone: [phone]   Fax:  [phone]  Physical Therapy Evaluation  Patient Details  Name: Matthew Meyer. MRN: [id number] Date of Birth: [birth year] Referring Provider: Dr. Eppie Gibson   Encounter Date: 12/02/2017  PT End of Session - 12/02/17 1132    Visit Number  1    Number of Visits  1    PT Start Time  0827    PT Stop Time  0900    PT Time Calculation (min)  33 min    Activity Tolerance  Patient tolerated treatment well    Behavior During Therapy  [facility] for tasks assessed/performed       Past Medical History:  Diagnosis Date  . 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 GERD (gastroesophageal reflux disease)   . Hypertension     Past Surgical History:  Procedure Laterality Date  . APPENDECTOMY     2nd grade  . LEG SURGERY Left    surgery for a broken leg when he was 62 years old   . MULTIPLE EXTRACTIONS WITH ALVEOLOPLASTY N/A 11/20/2017   Procedure: Extaction of tooth #'s 14 and 15 with alveoloplasty and gross debridement of remaining teeth;  Surgeon: Lenn Cal, DDS;  Location: [facility];  Service: Oral Surgery;  Laterality: N/A;  . PAROTIDECTOMY Left 11/06/2017   Procedure: PAROTIDECTOMY;  Surgeon: [name], MD;  Location: [facility] OR;  Service: ENT;  Laterality: Left;  Possible skin graft, possible nerve graft Needs RNFA  . RADICAL NECK DISSECTION Left 11/06/2017   Procedure: RADICAL NECK DISSECTION;  Surgeon: [name], MD;  Location: [place];  Service: ENT;  Laterality: Left;  . SKIN SPLIT GRAFT Left 11/06/2017   Procedure: SKIN GRAFT SPLIT THICKNESS;  Surgeon: [name], MD;  Location: [place];  Service: ENT;  Laterality: Left;    There were no vitals filed for this visit.   Subjective Assessment - 12/02/17 1008    Subjective  I walk an hour a day.  My right ankle is sore. It's fun, because I had broken the left ankle.    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  Had parotidectomy and left neck dissection 11/06/17. Adjuvant RT to start approx. 12/15/17.    Patient Stated Goals  get info from all head &  neck clinic providers    Currently in Pain?  No/denies         [facility] PT Assessment - 12/02/17 0001      Assessment   Medical Diagnosis  left parotid squamous cell carcinoma    Referring Provider  Dr. Eppie Gibson    Onset Date/Surgical Date  11/06/17 left parotidectomy + neck dissection    Hand Dominance  Right    Prior Therapy  none      Precautions   Precaution Comments  cancer precautions      Restrictions   Weight Bearing Restrictions  No      Balance Screen   Has the patient fallen in the past 6 months  No    Has the patient had a decrease in activity level because of a fear of falling?   No    Is the patient reluctant to leave their home because of a fear of falling?   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    Leisure  walks 1 hour/day      Cognition   Overall Cognitive Status  Within Functional Limits for tasks assessed    Behaviors  -- chatty      Observation/Other Assessments   Observations  gentleman with large gauze bandage over the left side of his face and another at anterior left thigh, the skin graft donor site; has left facial droop and decreased mouth opening on left bandages not removed to view wounds today      Coordination   Gross Motor Movements are Fluid and Coordinated  Yes      Functional Tests   Functional tests  Sit to Stand      Sit to Stand   Comments  16 times in 30 seconds, just above average for his  age      Posture/Postural Control   Posture/Postural Control  Postural limitations    Postural Limitations  Forward head      ROM / Strength   AROM / PROM / Strength  AROM      AROM   Overall AROM   Deficits    Overall AROM Comments  neck AROM limited as follows: extensin 25%, rotation right 25%, left 30%; sidebend 50% bilat.  Shoulder AROM WFL but left moves more slowly into full abduction than right      Ambulation/Gait   Ambulation/Gait  Yes    Ambulation/Gait Assistance  7: Independent        LYMPHEDEMA/ONCOLOGY QUESTIONNAIRE - 12/02/17 1129      Type   Cancer Type  left parotid squamous cell      Surgeries   Other Surgery Date  11/06/17 parotidectomy, left neck dissection      Treatment   Active Radiation Treatment  -- to start approx. 12/15/17      Lymphedema Assessments   Lymphedema Assessments  Head and Neck      Head and Neck   4 cm superior to sternal notch around neck  40.5 cm    6 cm superior to sternal notch around neck  41.1 cm    8 cm superior to sternal notch around neck  42.2 cm             Objective measurements completed on examination: See above findings.              PT Education - 12/02/17 1131    Education provided  Yes    Education Details  neck ROM, posture, breathing, walking, CURE article on staying active, "Why exercise?" flyer, PT and lymphedema info    Person(s) Educated  Patient    Methods  Explanation;Handout    Comprehension  Verbalized understanding              Head and Neck Clinic Goals - 12/02/17 1136      Patient will be able to verbalize understanding of a home exercise program for cervical range of motion, posture, and walking.    Status  Achieved      Patient will be able to verbalize understanding of proper sitting and standing posture.    Status  Achieved      Patient will  be able to verbalize understanding of lymphedema risk and availability of treatment for this condition.     Status  Achieved         Plan - 12/02/17 1132    Clinical Impression Statement  Pt. s/p left parotidectomy and neck dissection for squamous cell carcinoma, to have adjuvant RT starting approx. 12/15/17. He has forward head posture and decreased neck ROM. He is at significant risk for lymphedema having had both a neck dissection and he is to have RT. Reports good activity level.    History and Personal Factors relevant to plan of care:  none    Clinical Presentation  Evolving    Clinical Presentation due to:  recovering from surgery, and to start RT soon    Clinical Decision Making  Low    Rehab Potential  Good    PT Frequency  One time visit    PT Treatment/Interventions  Patient/family education    PT Next Visit Plan  No follow-up currently planned, but patient is at high risk for developing lymphedema    PT Home Exercise Plan  continue walking, do neck ROM    Consulted and Agree with Plan of Care  Patient       Patient will benefit from skilled therapeutic intervention in order to improve the following deficits and impairments:  Decreased range of motion, Postural dysfunction, Decreased knowledge of precautions  Visit Diagnosis: Malignant neoplasm of parotid gland ([place]) - Plan: PT plan of care cert/re-cert  At risk for lymphedema - Plan: PT plan of care cert/re-cert  Abnormal posture - Plan: PT plan of care cert/re-cert  Aftercare following surgery for neoplasm - Plan: PT plan of care cert/re-cert     Problem List Patient Active Problem List   Diagnosis Date Noted  . 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 Cancer of parotid gland ([place]) 10/31/2017  . Gastroesophageal reflux disease with esophagitis 05/27/2017  . Hypertension, essential 05/27/2017  . Allergic rhinitis 11/21/2016    Matthew Meyer 12/02/2017, 11:37 AM  [place], [place], [address] Phone: [phone]   Fax:   [phone]  Name: Matthew Meyer. MRN: [id number] Date of Birth: 1955/11/26  Serafina Royals, PT 12/02/17 11:38 AM

## 2017-12-03 ENCOUNTER — Ambulatory Visit (HOSPITAL_COMMUNITY): Payer: Self-pay | Admitting: Dentistry

## 2017-12-03 ENCOUNTER — Encounter (HOSPITAL_COMMUNITY): Payer: Self-pay | Admitting: Dentistry

## 2017-12-03 ENCOUNTER — Ambulatory Visit
Admission: RE | Admit: 2017-12-03 | Discharge: 2017-12-03 | Disposition: A | Discharge status: Home / Self Care | Payer: BLUE CROSS/BLUE SHIELD | Source: Ambulatory Visit | Attending: Radiation Oncology | Admitting: Radiation Oncology

## 2017-12-03 ENCOUNTER — Encounter: Payer: Self-pay | Admitting: *Deleted

## 2017-12-03 VITALS — BP 118/73 | HR 59 | Temp 98.6°F | Resp 20 | Ht 68.0 in | Wt 202.2 lb

## 2017-12-03 VITALS — BP 122/69 | HR 64 | Temp 97.8°F

## 2017-12-03 DIAGNOSIS — Z01818 Encounter for other preprocedural examination: Secondary | ICD-10-CM

## 2017-12-03 DIAGNOSIS — C07 Malignant neoplasm of parotid gland: Secondary | ICD-10-CM | POA: Diagnosis not present

## 2017-12-03 DIAGNOSIS — Z463 Encounter for fitting and adjustment of dental prosthetic device: Secondary | ICD-10-CM

## 2017-12-03 DIAGNOSIS — Z510 Encounter for antineoplastic radiation therapy: Secondary | ICD-10-CM | POA: Diagnosis not present

## 2017-12-03 DIAGNOSIS — K08109 Complete loss of teeth, unspecified cause, unspecified class: Secondary | ICD-10-CM | POA: Diagnosis not present

## 2017-12-03 DIAGNOSIS — C089 Malignant neoplasm of major salivary gland, unspecified: Secondary | ICD-10-CM

## 2017-12-03 DIAGNOSIS — K08199 Complete loss of teeth due to other specified cause, unspecified class: Secondary | ICD-10-CM

## 2017-12-03 MED ORDER — SODIUM CHLORIDE 0.9% FLUSH
10.0000 mL | Freq: Once | INTRAVENOUS | Status: AC
  Administered 2017-12-03: 10 mL via INTRAVENOUS

## 2017-12-03 MED ORDER — SODIUM FLUORIDE 1.1 % DT GEL: DENTAL | 99 refills | Status: DC

## 2017-12-03 MED FILL — FLUORISHIELD 1.1% GEL: 1.1 % | 28 days supply | Qty: 114 | Fill #0

## 2017-12-03 NOTE — Patient Instructions (Signed)
PLAN: 1. Continue to use salt water rinses as needed to aid healing. 2. Brush teeth after meals and at bedtime. 3.  Use fluoride in the fluoride trays at bedtime as directed. 4. Use trismus exercises as directed. 5. Return to clinic in 2-3 weeks for oral examination during radiation therapy. Patient to call for an appointment once he is aware of the schedule for his radiation therapy treatments.  Lenn Cal, DDS   FLUORIDE TRAYS PATIENT INSTRUCTIONS    Obtain prescription from the pharmacy.  Don't be surprised if it needs to be ordered.  Be sure to let the pharmacy know when you are close to needing a new refill for them to have it ready for you without interruption of Fluoride use.  The best time to use your Fluoride is before bed time.  You must brush your teeth very well and floss before using the Fluoride in order to get the best use out of the Fluoride treatments.  Place 1 drop of Fluoride gel per tooth in the tray.  Place the tray on your lower teeth and your upper teeth.  Make sure the trays are seated all the way.  Remember, they only fit one way on your teeth.  Insert for 5 full minutes.  At the end of the 5 minutes, take the trays out.  SPIT OUT excess. .  Do NOT rinse your mouth!  Do NOT eat or drink after treatments for at least 30 minutes.  This is why the best time for your treatments is before bedtime.  Clean the inside of your Fluoride trays using COLD WATER and a toothbrush.  In order to keep your Trays from discoloring and free from odors, soak them overnight in denture cleaners such as Efferdent.  Do not use bleach or non denture products.  Store the trays in a safe dry place AWAY from any heat until your next treatment.  If anything happens to your Fluoride trays, or they don't fit as well after any dental work, please let us know as soon as possible.

## 2017-12-03 NOTE — Progress Notes (Signed)
Has armband been applied?  Yes  Does patient have an allergy to IV contrast dye?: No  Has patient ever received premedication for IV contrast dye?: N/A  Does patient take metformin?: No  If patient does take metformin when was the last dose: N/A  Date of lab work: 11/19/17 BUN: 15 CR: 1.10 EGFR: >60  IV site: Right antecubital  Has IV site been added to flowsheet?

## 2017-12-03 NOTE — Progress Notes (Signed)
Head and Neck Cancer Simulation, IMRT treatment planning note   Outpatient  Diagnosis:    ICD-10-CM   1. Cancer of parotid gland [facility]) C07     The patient was taken to the CT simulator and laid in the supine position on the table. Wiring was placed around the skin graft where the tumor was removed. An Aquaplast head and shoulder mask was custom fitted to the patient's anatomy. High-resolution CT axial imaging was obtained of the head and neck . I verified that the quality of the imaging is good for treatment planning. 1 Medically Necessary Treatment Device was fabricated and supervised by me: Aquaplast mask.  Treatment planning note I plan to treat the patient with IMRT. I plan to treat the patient's tumor bed, and left skull base/neck. I plan to treat to a total dose of 66 Gray in 33  fractions. Dose calculation was ordered from dosimetry.  IMRT planning Note  IMRT is medically necessary and an important modality to deliver adequate dose to the patient's at risk tissues while sparing the patient's normal structures, including the: esophagus, parotid tissue, mandible, brain stem, spinal cord, oral cavity, brachial plexus.  This justifies the use of IMRT in the patient's treatment.    -----------------------------------  Eppie Gibson, MD

## 2017-12-03 NOTE — Progress Notes (Signed)
Progress note:  12/03/2017 Matthew Meyer [place]. [id number]  VITALS: BP 122/69 (BP Location: Left Arm)   Pulse 64   Temp 97.8 F (36.6 C)   LABS:  Lab Results  Component Value Date   WBC 6.0 11/19/2017   HGB 12.2 (L) 11/19/2017   HCT 36.7 (L) 11/19/2017   MCV 85.0 11/19/2017   PLT 341 11/19/2017   BMET    Component Value Date/Time   NA 140 11/19/2017 1425   K 4.2 11/19/2017 1425   CL 103 11/19/2017 1425   CO2 26 11/19/2017 1425   GLUCOSE 113 (H) 11/19/2017 1425   BUN 15 11/19/2017 1425   CREATININE 1.10 11/19/2017 1425   CREATININE 1.15 10/31/2017 0938   CALCIUM 9.9 11/19/2017 1425   GFRNONAA >60 11/19/2017 1425   GFRNONAA >60 10/31/2017 0938   GFRAA >60 11/19/2017 1425   GFRAA >60 10/31/2017 0938    No results found for: INR, PROTIME No results found for: PTT   [name] is status post multiple extractions with alveoloplasty and gross debridement of remaining dentition in the operating room on 11/20/2017. The patient now presents for evaluation of healing and insertion of upper and lower fluoride trays and scatter protection devices.  SUBJECTIVE: Patient with minimal complaints from dental extraction sites. Stitches are still present.  EXAM: There is no sign of infection, heme, or ooze. Sutures are loosely intact. Patient is healing in by generalized primary closure.  PROCEDURE: The patient was given a chlorhexidine gluconate rinse for 30 seconds. Sutures were then removed without complication. Patient tolerated the procedure well.  Fluoride and scatter protection Appliances were tried in and adjusted as needed. [place] Trismus device was previously fabricated at 50 mm using 27 sticks. Postop instructions were provided and a written and verbal format concerning the use and care of appliances. Prescription for FluorioSHIELD was sent to [facility] with refills for one year. Normal directions All questions were  answered.  ASSESSMENT: Post operative course is consistent with dental procedures performed in the OR. Patient is cleared for start of radiation therapy from a dental standpoint.  PLAN: 1. Continue to use salt water rinses as needed to aid healing. 2. Brush teeth after meals and at bedtime. 3.  Use fluoride in the fluoride trays at bedtime as directed. 4. Use trismus exercises as directed. 5. Return to clinic in 2-3 weeks for oral examination during radiation therapy. Patient to call for an appointment once he is aware of the schedule for his radiation therapy treatments.   Lenn Cal, DDS

## 2017-12-04 NOTE — Progress Notes (Signed)
Oncology Nurse Navigator Documentation  Met with Matthew Meyer upon his arrival for H[facility]N [place].  He was unaccompanied.  Provided verbal and written overview of [place], the clinicians who will be seeing him, encouraged him to ask questions during his time with them.  He was seen by Nutrition, PT, and [place] with him at end of [facility], addressed questions. I encouraged him to call me with needs/concerns.  Gayleen Orem, RN, BSN Head & Neck Oncology [place] at [place] [phone]

## 2017-12-05 NOTE — Progress Notes (Signed)
Oncology Nurse Navigator Documentation  To provide support, encouragement and care continuity, met with Mr Ogborn during his CT SIM. He was unaccompanied.   He tolerated procedure without difficulty, denied questions/concerns.   I toured him to [facility] treatment area, explained procedures for lobby registration, arrival to Radiation Waiting, arrival to tmt area and preparation for tmt.  He voiced understanding.   Provided New Patient Information packet, discussed contents:  Contact information for physician(s), myself, other members of the Care Team.  Advance Directive information ([place] blue pamphlet with LCSW contact info)  Fall Prevention Patient Safety Plan  Appointment [place] sheet  [place] campus map with highlight of [place] him to call me prior to 6/10 [place] w/ questions/concerns.  Gayleen Orem, RN, BSN Head & Neck Oncology Nurse [place] at [place] [phone]

## 2017-12-10 DIAGNOSIS — Z510 Encounter for antineoplastic radiation therapy: Secondary | ICD-10-CM | POA: Diagnosis not present

## 2017-12-10 DIAGNOSIS — C07 Malignant neoplasm of parotid gland: Secondary | ICD-10-CM | POA: Insufficient documentation

## 2017-12-15 ENCOUNTER — Ambulatory Visit
Admission: RE | Admit: 2017-12-15 | Discharge: 2017-12-15 | Disposition: A | Discharge status: Home / Self Care | Payer: BLUE CROSS/BLUE SHIELD | Source: Ambulatory Visit | Attending: Radiation Oncology | Admitting: Radiation Oncology

## 2017-12-15 ENCOUNTER — Encounter: Payer: Self-pay | Admitting: *Deleted

## 2017-12-15 DIAGNOSIS — C07 Malignant neoplasm of parotid gland: Secondary | ICD-10-CM

## 2017-12-15 DIAGNOSIS — Z510 Encounter for antineoplastic radiation therapy: Secondary | ICD-10-CM | POA: Diagnosis not present

## 2017-12-15 MED ORDER — SONAFINE EX EMUL
1.0000 "application " | Freq: Once | CUTANEOUS | Status: AC
  Administered 2017-12-15: 1 via TOPICAL

## 2017-12-15 NOTE — Progress Notes (Signed)

## 2017-12-15 NOTE — Progress Notes (Signed)
Oncology Nurse Navigator Documentation  To provide support, encouragement and care continuity, met with Mr Luczak for his initial  RT.  He was accompanied by his mother.  I reviewed the 2-step treatment process, answered questions.   Mr. Worlds completed treatment without difficulty, denied questions/concerns.  I reviewed the registration/arrival procedure for subsequent treatments.  He stated he scanned at the lobby kiosk but his arrival did not register today.  I escorted him to weekly PUT with Dr. Isidore Moos.  ncouraged him to call me with questions/concerns as tmts proceed.  He voiced understanding.  Gayleen Orem, RN, BSN Head & Neck Oncology Nurse [place] at [place] [phone]

## 2017-12-16 ENCOUNTER — Ambulatory Visit
Admission: RE | Admit: 2017-12-16 | Discharge: 2017-12-16 | Disposition: A | Discharge status: Home / Self Care | Payer: BLUE CROSS/BLUE SHIELD | Source: Ambulatory Visit | Attending: Radiation Oncology | Admitting: Radiation Oncology

## 2017-12-16 DIAGNOSIS — Z510 Encounter for antineoplastic radiation therapy: Secondary | ICD-10-CM | POA: Diagnosis not present

## 2017-12-16 DIAGNOSIS — C07 Malignant neoplasm of parotid gland: Secondary | ICD-10-CM | POA: Diagnosis not present

## 2017-12-17 ENCOUNTER — Ambulatory Visit
Admission: RE | Admit: 2017-12-17 | Discharge: 2017-12-17 | Disposition: A | Discharge status: Home / Self Care | Payer: BLUE CROSS/BLUE SHIELD | Source: Ambulatory Visit | Attending: Radiation Oncology | Admitting: Radiation Oncology

## 2017-12-17 DIAGNOSIS — C07 Malignant neoplasm of parotid gland: Secondary | ICD-10-CM | POA: Diagnosis not present

## 2017-12-17 DIAGNOSIS — Z510 Encounter for antineoplastic radiation therapy: Secondary | ICD-10-CM | POA: Diagnosis not present

## 2017-12-18 ENCOUNTER — Ambulatory Visit
Admission: RE | Admit: 2017-12-18 | Discharge: 2017-12-18 | Disposition: A | Discharge status: Home / Self Care | Payer: BLUE CROSS/BLUE SHIELD | Source: Ambulatory Visit | Attending: Radiation Oncology | Admitting: Radiation Oncology

## 2017-12-18 DIAGNOSIS — Z510 Encounter for antineoplastic radiation therapy: Secondary | ICD-10-CM | POA: Diagnosis not present

## 2017-12-18 DIAGNOSIS — C07 Malignant neoplasm of parotid gland: Secondary | ICD-10-CM | POA: Diagnosis not present

## 2017-12-19 ENCOUNTER — Ambulatory Visit
Admission: RE | Admit: 2017-12-19 | Discharge: 2017-12-19 | Disposition: A | Discharge status: Home / Self Care | Payer: BLUE CROSS/BLUE SHIELD | Source: Ambulatory Visit | Attending: Radiation Oncology | Admitting: Radiation Oncology

## 2017-12-19 DIAGNOSIS — C07 Malignant neoplasm of parotid gland: Secondary | ICD-10-CM | POA: Diagnosis not present

## 2017-12-19 DIAGNOSIS — Z510 Encounter for antineoplastic radiation therapy: Secondary | ICD-10-CM | POA: Diagnosis not present

## 2017-12-22 ENCOUNTER — Ambulatory Visit
Admission: RE | Admit: 2017-12-22 | Discharge: 2017-12-22 | Disposition: A | Discharge status: Home / Self Care | Payer: BLUE CROSS/BLUE SHIELD | Source: Ambulatory Visit | Attending: Radiation Oncology | Admitting: Radiation Oncology

## 2017-12-22 DIAGNOSIS — Z510 Encounter for antineoplastic radiation therapy: Secondary | ICD-10-CM | POA: Diagnosis not present

## 2017-12-22 DIAGNOSIS — C07 Malignant neoplasm of parotid gland: Secondary | ICD-10-CM | POA: Diagnosis not present

## 2017-12-23 ENCOUNTER — Ambulatory Visit
Admission: RE | Admit: 2017-12-23 | Discharge: 2017-12-23 | Disposition: A | Discharge status: Home / Self Care | Payer: BLUE CROSS/BLUE SHIELD | Source: Ambulatory Visit | Attending: Radiation Oncology | Admitting: Radiation Oncology

## 2017-12-23 DIAGNOSIS — Z510 Encounter for antineoplastic radiation therapy: Secondary | ICD-10-CM | POA: Diagnosis not present

## 2017-12-23 DIAGNOSIS — C07 Malignant neoplasm of parotid gland: Secondary | ICD-10-CM | POA: Diagnosis not present

## 2017-12-24 ENCOUNTER — Ambulatory Visit
Admission: RE | Admit: 2017-12-24 | Discharge: 2017-12-24 | Disposition: A | Discharge status: Home / Self Care | Payer: BLUE CROSS/BLUE SHIELD | Source: Ambulatory Visit | Attending: Radiation Oncology | Admitting: Radiation Oncology

## 2017-12-24 DIAGNOSIS — Z510 Encounter for antineoplastic radiation therapy: Secondary | ICD-10-CM | POA: Diagnosis not present

## 2017-12-24 DIAGNOSIS — C07 Malignant neoplasm of parotid gland: Secondary | ICD-10-CM | POA: Diagnosis not present

## 2017-12-25 ENCOUNTER — Encounter: Payer: Self-pay | Admitting: Nutrition

## 2017-12-25 ENCOUNTER — Inpatient Hospital Stay: Payer: BLUE CROSS/BLUE SHIELD | Attending: Radiation Oncology | Admitting: Nutrition

## 2017-12-25 ENCOUNTER — Ambulatory Visit
Admission: RE | Admit: 2017-12-25 | Discharge: 2017-12-25 | Disposition: A | Discharge status: Home / Self Care | Payer: BLUE CROSS/BLUE SHIELD | Source: Ambulatory Visit | Attending: Radiation Oncology | Admitting: Radiation Oncology

## 2017-12-25 DIAGNOSIS — C07 Malignant neoplasm of parotid gland: Secondary | ICD-10-CM | POA: Diagnosis not present

## 2017-12-25 DIAGNOSIS — Z510 Encounter for antineoplastic radiation therapy: Secondary | ICD-10-CM | POA: Diagnosis not present

## 2017-12-25 NOTE — Progress Notes (Signed)
Patient did not show up for nutrition follow-up. 

## 2017-12-26 ENCOUNTER — Ambulatory Visit
Admission: RE | Admit: 2017-12-26 | Discharge: 2017-12-26 | Disposition: A | Discharge status: Home / Self Care | Payer: BLUE CROSS/BLUE SHIELD | Source: Ambulatory Visit | Attending: Radiation Oncology | Admitting: Radiation Oncology

## 2017-12-26 DIAGNOSIS — Z510 Encounter for antineoplastic radiation therapy: Secondary | ICD-10-CM | POA: Diagnosis not present

## 2017-12-26 DIAGNOSIS — C07 Malignant neoplasm of parotid gland: Secondary | ICD-10-CM | POA: Diagnosis not present

## 2017-12-29 ENCOUNTER — Ambulatory Visit
Admission: RE | Admit: 2017-12-29 | Discharge: 2017-12-29 | Disposition: A | Discharge status: Home / Self Care | Payer: BLUE CROSS/BLUE SHIELD | Source: Ambulatory Visit | Attending: Radiation Oncology | Admitting: Radiation Oncology

## 2017-12-29 DIAGNOSIS — C07 Malignant neoplasm of parotid gland: Secondary | ICD-10-CM | POA: Diagnosis not present

## 2017-12-29 DIAGNOSIS — Z510 Encounter for antineoplastic radiation therapy: Secondary | ICD-10-CM | POA: Diagnosis not present

## 2017-12-30 ENCOUNTER — Ambulatory Visit
Admission: RE | Admit: 2017-12-30 | Discharge: 2017-12-30 | Disposition: A | Discharge status: Home / Self Care | Payer: BLUE CROSS/BLUE SHIELD | Source: Ambulatory Visit | Attending: Radiation Oncology | Admitting: Radiation Oncology

## 2017-12-30 DIAGNOSIS — Z510 Encounter for antineoplastic radiation therapy: Secondary | ICD-10-CM | POA: Diagnosis not present

## 2017-12-30 DIAGNOSIS — C07 Malignant neoplasm of parotid gland: Secondary | ICD-10-CM | POA: Diagnosis not present

## 2017-12-31 ENCOUNTER — Telehealth: Payer: Self-pay | Admitting: *Deleted

## 2017-12-31 ENCOUNTER — Ambulatory Visit
Admission: RE | Admit: 2017-12-31 | Discharge: 2017-12-31 | Disposition: A | Discharge status: Home / Self Care | Payer: BLUE CROSS/BLUE SHIELD | Source: Ambulatory Visit | Attending: Radiation Oncology | Admitting: Radiation Oncology

## 2017-12-31 DIAGNOSIS — Z510 Encounter for antineoplastic radiation therapy: Secondary | ICD-10-CM | POA: Diagnosis not present

## 2017-12-31 DIAGNOSIS — C07 Malignant neoplasm of parotid gland: Secondary | ICD-10-CM | POA: Diagnosis not present

## 2017-12-31 NOTE — Telephone Encounter (Signed)
Oncology Nurse Navigator Documentation  In follow-up to communications this navigator had with Laurine Blazer, [place] Risk, re extension of patient's STD, faxed the following support documentation: . Return to Work Medical Evaluation Form completed by Dr.Squire . Authorization for Disclosure of Protected Health Information signed by patient . Diagnostic Reports o CT Soft Tissue Neck W Contrast - 10/16/2017 o Pathology - 12/09/2017 . Initial Consult with Radiation Oncologist, Dr. Isidore Moos - 10/31/2017 . Radiation Planning Note, Dr. Isidore Moos - 12/03/2017 . Weekly Treatment Note, Dr. Isidore Moos - 12/15/2017 . Weekly Treatment Note, Dr. Isidore Moos - 12/23/2017 . Weekly Treatment note, Dr. Isidore Moos - 12/29/2017  Notification of successful fax transmission received.  Gayleen Orem, RN, BSN Head & Neck Oncology Nurse [place] at [place] [phone]

## 2018-01-01 ENCOUNTER — Ambulatory Visit
Admission: RE | Admit: 2018-01-01 | Discharge: 2018-01-01 | Disposition: A | Discharge status: Home / Self Care | Payer: BLUE CROSS/BLUE SHIELD | Source: Ambulatory Visit | Attending: Radiation Oncology | Admitting: Radiation Oncology

## 2018-01-01 DIAGNOSIS — Z510 Encounter for antineoplastic radiation therapy: Secondary | ICD-10-CM | POA: Diagnosis not present

## 2018-01-01 DIAGNOSIS — C07 Malignant neoplasm of parotid gland: Secondary | ICD-10-CM | POA: Diagnosis not present

## 2018-01-02 ENCOUNTER — Ambulatory Visit
Admission: RE | Admit: 2018-01-02 | Discharge: 2018-01-02 | Disposition: A | Discharge status: Home / Self Care | Payer: BLUE CROSS/BLUE SHIELD | Source: Ambulatory Visit | Attending: Radiation Oncology | Admitting: Radiation Oncology

## 2018-01-02 DIAGNOSIS — Z510 Encounter for antineoplastic radiation therapy: Secondary | ICD-10-CM | POA: Diagnosis not present

## 2018-01-02 DIAGNOSIS — C07 Malignant neoplasm of parotid gland: Secondary | ICD-10-CM | POA: Diagnosis not present

## 2018-01-05 ENCOUNTER — Ambulatory Visit
Admission: RE | Admit: 2018-01-05 | Discharge: 2018-01-05 | Disposition: A | Discharge status: Home / Self Care | Payer: BLUE CROSS/BLUE SHIELD | Source: Ambulatory Visit | Attending: Radiation Oncology | Admitting: Radiation Oncology

## 2018-01-05 ENCOUNTER — Other Ambulatory Visit: Payer: Self-pay | Admitting: Radiation Oncology

## 2018-01-05 DIAGNOSIS — C07 Malignant neoplasm of parotid gland: Secondary | ICD-10-CM

## 2018-01-05 DIAGNOSIS — Z510 Encounter for antineoplastic radiation therapy: Secondary | ICD-10-CM | POA: Diagnosis not present

## 2018-01-05 NOTE — Progress Notes (Signed)

## 2018-01-06 ENCOUNTER — Telehealth: Payer: Self-pay | Admitting: *Deleted

## 2018-01-06 ENCOUNTER — Ambulatory Visit
Admission: RE | Admit: 2018-01-06 | Discharge: 2018-01-06 | Disposition: A | Discharge status: Home / Self Care | Payer: BLUE CROSS/BLUE SHIELD | Source: Ambulatory Visit | Attending: Radiation Oncology | Admitting: Radiation Oncology

## 2018-01-06 ENCOUNTER — Inpatient Hospital Stay: Payer: BLUE CROSS/BLUE SHIELD | Attending: Radiation Oncology | Admitting: Nutrition

## 2018-01-06 DIAGNOSIS — Z510 Encounter for antineoplastic radiation therapy: Secondary | ICD-10-CM | POA: Diagnosis not present

## 2018-01-06 DIAGNOSIS — C07 Malignant neoplasm of parotid gland: Secondary | ICD-10-CM | POA: Diagnosis not present

## 2018-01-06 NOTE — Telephone Encounter (Signed)
Called patient to inform of appt. with Dr. Manuella Ghazi on 01-13-18 - arrival time - 7:55 am, address- [place] ph. No. [phone], lvm for a return call

## 2018-01-06 NOTE — Progress Notes (Signed)
Nutrition follow-up completed with patient after radiation therapy for left parotid cancer. Patient reports he is taking MiraLAX for constipation. He is beginning to have taste alterations. Reports decreased appetite but is forcing himself to eat. He is trying to eat soup with meals to make swallowing easier. He has not tried oral nutrition supplements yet. Weight is stable at 202.2 pounds July 1.  Nutrition diagnosis: Predicted suboptimal energy intake continues.  Intervention: Educated patient to continue higher calorie, higher protein foods frequently throughout the day. Recommended patient add oral nutrition supplements twice daily. Provided additional samples and coupons. Encouraged continued bowel regimen. Teach back method used.  Questions were answered.  Monitoring, evaluation, goals: Patient will tolerate adequate calories and protein to minimize weight loss.  Next visit: Wednesday, July 10.  **Disclaimer: This note was dictated with voice recognition software. Similar sounding words can inadvertently be transcribed and this note may contain transcription errors which may not have been corrected upon publication of note.**

## 2018-01-07 ENCOUNTER — Ambulatory Visit
Admission: RE | Admit: 2018-01-07 | Discharge: 2018-01-07 | Disposition: A | Discharge status: Home / Self Care | Payer: BLUE CROSS/BLUE SHIELD | Source: Ambulatory Visit | Attending: Radiation Oncology | Admitting: Radiation Oncology

## 2018-01-07 DIAGNOSIS — Z510 Encounter for antineoplastic radiation therapy: Secondary | ICD-10-CM | POA: Diagnosis not present

## 2018-01-07 DIAGNOSIS — C07 Malignant neoplasm of parotid gland: Secondary | ICD-10-CM | POA: Diagnosis not present

## 2018-01-09 ENCOUNTER — Ambulatory Visit
Admission: RE | Admit: 2018-01-09 | Discharge: 2018-01-09 | Disposition: A | Discharge status: Home / Self Care | Payer: BLUE CROSS/BLUE SHIELD | Source: Ambulatory Visit | Attending: Radiation Oncology | Admitting: Radiation Oncology

## 2018-01-09 DIAGNOSIS — C07 Malignant neoplasm of parotid gland: Secondary | ICD-10-CM | POA: Diagnosis not present

## 2018-01-09 DIAGNOSIS — Z510 Encounter for antineoplastic radiation therapy: Secondary | ICD-10-CM | POA: Diagnosis not present

## 2018-01-12 ENCOUNTER — Ambulatory Visit
Admission: RE | Admit: 2018-01-12 | Discharge: 2018-01-12 | Disposition: A | Discharge status: Home / Self Care | Payer: BLUE CROSS/BLUE SHIELD | Source: Ambulatory Visit | Attending: Radiation Oncology | Admitting: Radiation Oncology

## 2018-01-12 DIAGNOSIS — C07 Malignant neoplasm of parotid gland: Secondary | ICD-10-CM | POA: Diagnosis not present

## 2018-01-12 DIAGNOSIS — Z510 Encounter for antineoplastic radiation therapy: Secondary | ICD-10-CM | POA: Diagnosis not present

## 2018-01-13 ENCOUNTER — Ambulatory Visit
Admission: RE | Admit: 2018-01-13 | Discharge: 2018-01-13 | Disposition: A | Discharge status: Home / Self Care | Payer: BLUE CROSS/BLUE SHIELD | Source: Ambulatory Visit | Attending: Radiation Oncology | Admitting: Radiation Oncology

## 2018-01-13 DIAGNOSIS — C07 Malignant neoplasm of parotid gland: Secondary | ICD-10-CM | POA: Diagnosis not present

## 2018-01-13 DIAGNOSIS — Z510 Encounter for antineoplastic radiation therapy: Secondary | ICD-10-CM | POA: Diagnosis not present

## 2018-01-13 DIAGNOSIS — H02105 Unspecified ectropion of left lower eyelid: Secondary | ICD-10-CM | POA: Diagnosis not present

## 2018-01-14 ENCOUNTER — Ambulatory Visit
Admission: RE | Admit: 2018-01-14 | Discharge: 2018-01-14 | Disposition: A | Discharge status: Home / Self Care | Payer: BLUE CROSS/BLUE SHIELD | Source: Ambulatory Visit | Attending: Radiation Oncology | Admitting: Radiation Oncology

## 2018-01-14 ENCOUNTER — Inpatient Hospital Stay: Payer: BLUE CROSS/BLUE SHIELD | Admitting: Nutrition

## 2018-01-14 DIAGNOSIS — Z510 Encounter for antineoplastic radiation therapy: Secondary | ICD-10-CM | POA: Diagnosis not present

## 2018-01-14 DIAGNOSIS — C07 Malignant neoplasm of parotid gland: Secondary | ICD-10-CM | POA: Diagnosis not present

## 2018-01-14 NOTE — Progress Notes (Signed)
Nutrition follow-up completed with patient after radiation therapy for left parotid cancer. Constipation is improved. He continues to have taste alterations which he describes as "no taste" at all. He is forcing himself to eat. He continues to have poor appetite. He is trying a variety of foods which have protein and extra liquid to make swallowing easier. He reports he likes vanilla Ensure Enlive. Weight decreased slightly to 201.2 pounds today from 202.2 pounds July 1.  Nutrition diagnosis: Predicted suboptimal energy intake continues.  Intervention: Educated patient to begin 1-2 bottles of vanilla Ensure Enlive between meals. Provided 1 complementary case of Ensure Enlive. Enforced the importance of continuing adequate oral intake. Teach back method used.  Monitoring, evaluation, goals: Patient will tolerate increased calories and protein to minimize weight loss throughout treatment.  Next visit: Wednesday, July 17 after radiation therapy.  **Disclaimer: This note was dictated with voice recognition software. Similar sounding words can inadvertently be transcribed and this note may contain transcription errors which may not have been corrected upon publication of note.**

## 2018-01-15 ENCOUNTER — Ambulatory Visit
Admission: RE | Admit: 2018-01-15 | Discharge: 2018-01-15 | Disposition: A | Discharge status: Home / Self Care | Payer: BLUE CROSS/BLUE SHIELD | Source: Ambulatory Visit | Attending: Radiation Oncology | Admitting: Radiation Oncology

## 2018-01-15 DIAGNOSIS — C07 Malignant neoplasm of parotid gland: Secondary | ICD-10-CM | POA: Diagnosis not present

## 2018-01-15 DIAGNOSIS — Z510 Encounter for antineoplastic radiation therapy: Secondary | ICD-10-CM | POA: Diagnosis not present

## 2018-01-16 ENCOUNTER — Encounter: Payer: Self-pay | Admitting: *Deleted

## 2018-01-16 ENCOUNTER — Ambulatory Visit
Admission: RE | Admit: 2018-01-16 | Discharge: 2018-01-16 | Disposition: A | Discharge status: Home / Self Care | Payer: BLUE CROSS/BLUE SHIELD | Source: Ambulatory Visit | Attending: Radiation Oncology | Admitting: Radiation Oncology

## 2018-01-16 DIAGNOSIS — Z510 Encounter for antineoplastic radiation therapy: Secondary | ICD-10-CM | POA: Diagnosis not present

## 2018-01-16 DIAGNOSIS — C07 Malignant neoplasm of parotid gland: Secondary | ICD-10-CM | POA: Diagnosis not present

## 2018-01-16 NOTE — Progress Notes (Signed)
Oncology Nurse Navigator Documentation  Met with Mr. Matthew Meyer s/p today's RT to check on his well-being. He denied needs/concerns. He indicated he has received paperwork for extension of disability, needs to be submitted mid-August.  I encouraged him to bring to me next week.  Gayleen Orem, RN, BSN Head & Neck Oncology Nurse [place] at [place] [phone]

## 2018-01-19 ENCOUNTER — Encounter (HOSPITAL_COMMUNITY): Payer: Self-pay | Admitting: Dentistry

## 2018-01-19 ENCOUNTER — Ambulatory Visit
Admission: RE | Admit: 2018-01-19 | Discharge: 2018-01-19 | Disposition: A | Discharge status: Home / Self Care | Payer: BLUE CROSS/BLUE SHIELD | Source: Ambulatory Visit | Attending: Radiation Oncology | Admitting: Radiation Oncology

## 2018-01-19 ENCOUNTER — Ambulatory Visit (HOSPITAL_COMMUNITY): Payer: Self-pay | Admitting: Dentistry

## 2018-01-19 VITALS — BP 103/63 | HR 54 | Temp 98.5°F | Wt 200.0 lb

## 2018-01-19 DIAGNOSIS — K117 Disturbances of salivary secretion: Secondary | ICD-10-CM

## 2018-01-19 DIAGNOSIS — Z510 Encounter for antineoplastic radiation therapy: Secondary | ICD-10-CM | POA: Diagnosis not present

## 2018-01-19 DIAGNOSIS — R432 Parageusia: Secondary | ICD-10-CM

## 2018-01-19 DIAGNOSIS — C07 Malignant neoplasm of parotid gland: Secondary | ICD-10-CM

## 2018-01-19 DIAGNOSIS — K1233 Oral mucositis (ulcerative) due to radiation: Secondary | ICD-10-CM

## 2018-01-19 DIAGNOSIS — R1310 Dysphagia, unspecified: Secondary | ICD-10-CM

## 2018-01-19 DIAGNOSIS — R682 Dry mouth, unspecified: Secondary | ICD-10-CM

## 2018-01-19 MED ORDER — SONAFINE EX EMUL
1.0000 "application " | Freq: Once | CUTANEOUS | Status: AC
  Administered 2018-01-19: 1 via TOPICAL

## 2018-01-19 NOTE — Patient Instructions (Addendum)
RECOMMENDATIONS: 1. Brush after meals and at bedtime.  Use fluoride at bedtime. 2. Use trismus exercises as directed. 3. Use Listerene zero alcohol rinse or salt water/baking soda rinses. 4. Multiple sips of water as needed. 5. Return to clinic in two months for oral exam after radiation therapy. Call if problems before then.  Lenn Cal, DDS

## 2018-01-19 NOTE — Progress Notes (Signed)
01/19/2018  Patient Name:   Breckyn Ticas [facility]. Date of Birth:   [birth year] Medical Record Number: [id number]  BP 103/63 (BP Location: Right Arm)   Pulse (!) 54   Temp 98.5 F (36.9 C)   Wt 200 lb (90.7 kg)   BMI 30.41 kg/m   [name] presents for oral examination during radiation therapy. Patient has completed 20/35 radiation treatments. No chemotherapy.  REVIEW OF CHIEF COMPLAINTS:  DRY MOUTH: yes HARD TO SWALLOW: yes  HURT TO SWALLOW: yes TASTE CHANGES: No taste at all. SORES IN MOUTH: yes TRISMUS: no significant symptoms. WEIGHT: 200 pounds and stable.  HOME OH REGIMEN:  BRUSHING: once a day. Patient encouraged to brush after meals and at bedtime. FLOSSING: once a day RINSING: using salt water rinses and uses Listerine zero alcohol rinse as needed. Patient cautioned about using alcohol-based Listerine as it may cause soreness in the mouth. FLUORIDE:  Patient uses fluoride at bedtime unless he forgets. Patient encouraged to use fluoride at bedtime after brushing. TRISMUS EXERCISES:  Maximum interincisal opening: 45 mm down from initial 50 mm.   DENTAL EXAM:  Oral Hygiene:(PLAQUE): plaque noted. Patient encouraged to brush teeth after meals and at bedtime. LOCATION OF MUCOSITIS: back of throat. DESCRIPTION OF SALIVA: decreased saliva. ANY EXPOSED BONE: none noted. OTHER WATCHED AREAS:   Previous extraction sites area #14 and 15. DX: Xerostomia, Dysgeusia, Dysphagia, Odynophagia and Mucositis  RECOMMENDATIONS: 1. Brush after meals and at bedtime.  Use fluoride at bedtime. 2. Use trismus exercises as directed. 3. Use Listerene zero alcohol rinse or salt water/baking soda rinses. 4. Multiple sips of water as needed. 5. Return to clinic in two months for oral exam after radiation therapy. Call if problems before then.  Lenn Cal, DDS

## 2018-01-20 ENCOUNTER — Ambulatory Visit
Admission: RE | Admit: 2018-01-20 | Discharge: 2018-01-20 | Disposition: A | Discharge status: Home / Self Care | Payer: BLUE CROSS/BLUE SHIELD | Source: Ambulatory Visit | Attending: Radiation Oncology | Admitting: Radiation Oncology

## 2018-01-20 DIAGNOSIS — C07 Malignant neoplasm of parotid gland: Secondary | ICD-10-CM | POA: Diagnosis not present

## 2018-01-20 DIAGNOSIS — Z510 Encounter for antineoplastic radiation therapy: Secondary | ICD-10-CM | POA: Diagnosis not present

## 2018-01-21 ENCOUNTER — Inpatient Hospital Stay: Payer: BLUE CROSS/BLUE SHIELD | Admitting: Nutrition

## 2018-01-21 ENCOUNTER — Ambulatory Visit
Admission: RE | Admit: 2018-01-21 | Discharge: 2018-01-21 | Disposition: A | Discharge status: Home / Self Care | Payer: BLUE CROSS/BLUE SHIELD | Source: Ambulatory Visit | Attending: Radiation Oncology | Admitting: Radiation Oncology

## 2018-01-21 ENCOUNTER — Encounter: Payer: Self-pay | Admitting: Nutrition

## 2018-01-21 DIAGNOSIS — Z510 Encounter for antineoplastic radiation therapy: Secondary | ICD-10-CM | POA: Diagnosis not present

## 2018-01-21 DIAGNOSIS — C07 Malignant neoplasm of parotid gland: Secondary | ICD-10-CM | POA: Diagnosis not present

## 2018-01-21 NOTE — Progress Notes (Signed)
Patient did not show up for nutrition follow-up. 

## 2018-01-22 ENCOUNTER — Ambulatory Visit
Admission: RE | Admit: 2018-01-22 | Discharge: 2018-01-22 | Disposition: A | Discharge status: Home / Self Care | Payer: BLUE CROSS/BLUE SHIELD | Source: Ambulatory Visit | Attending: Radiation Oncology | Admitting: Radiation Oncology

## 2018-01-22 DIAGNOSIS — C07 Malignant neoplasm of parotid gland: Secondary | ICD-10-CM | POA: Diagnosis not present

## 2018-01-22 DIAGNOSIS — Z510 Encounter for antineoplastic radiation therapy: Secondary | ICD-10-CM | POA: Diagnosis not present

## 2018-01-23 ENCOUNTER — Ambulatory Visit
Admission: RE | Admit: 2018-01-23 | Discharge: 2018-01-23 | Disposition: A | Discharge status: Home / Self Care | Payer: BLUE CROSS/BLUE SHIELD | Source: Ambulatory Visit | Attending: Radiation Oncology | Admitting: Radiation Oncology

## 2018-01-23 DIAGNOSIS — C07 Malignant neoplasm of parotid gland: Secondary | ICD-10-CM | POA: Diagnosis not present

## 2018-01-23 DIAGNOSIS — Z510 Encounter for antineoplastic radiation therapy: Secondary | ICD-10-CM | POA: Diagnosis not present

## 2018-01-26 ENCOUNTER — Ambulatory Visit
Admission: RE | Admit: 2018-01-26 | Discharge: 2018-01-26 | Disposition: A | Discharge status: Home / Self Care | Payer: BLUE CROSS/BLUE SHIELD | Source: Ambulatory Visit | Attending: Radiation Oncology | Admitting: Radiation Oncology

## 2018-01-26 DIAGNOSIS — Z510 Encounter for antineoplastic radiation therapy: Secondary | ICD-10-CM | POA: Diagnosis not present

## 2018-01-26 DIAGNOSIS — C07 Malignant neoplasm of parotid gland: Secondary | ICD-10-CM | POA: Diagnosis not present

## 2018-01-27 ENCOUNTER — Encounter: Payer: Self-pay | Admitting: *Deleted

## 2018-01-27 ENCOUNTER — Ambulatory Visit
Admission: RE | Admit: 2018-01-27 | Discharge: 2018-01-27 | Disposition: A | Discharge status: Home / Self Care | Payer: BLUE CROSS/BLUE SHIELD | Source: Ambulatory Visit | Attending: Radiation Oncology | Admitting: Radiation Oncology

## 2018-01-27 DIAGNOSIS — C07 Malignant neoplasm of parotid gland: Secondary | ICD-10-CM | POA: Diagnosis not present

## 2018-01-27 DIAGNOSIS — Z510 Encounter for antineoplastic radiation therapy: Secondary | ICD-10-CM | POA: Diagnosis not present

## 2018-01-27 NOTE — Progress Notes (Signed)
Oncology Nurse Navigator Documentation  Met Mr. Matthew Meyer lobby where he provided documentation request from [place] re extension of Short Term Disability. I called [place] Specialist Dru Hamilton for clarification, LVMM requesting call-back.  Gayleen Orem, RN, BSN Head & Neck Oncology Nurse [place] at [place] [phone]

## 2018-01-28 ENCOUNTER — Inpatient Hospital Stay: Payer: Self-pay | Admitting: Nutrition

## 2018-01-28 ENCOUNTER — Telehealth: Payer: Self-pay | Admitting: *Deleted

## 2018-01-28 ENCOUNTER — Ambulatory Visit
Admission: RE | Admit: 2018-01-28 | Discharge: 2018-01-28 | Disposition: A | Discharge status: Home / Self Care | Payer: BLUE CROSS/BLUE SHIELD | Source: Ambulatory Visit | Attending: Radiation Oncology | Admitting: Radiation Oncology

## 2018-01-28 ENCOUNTER — Encounter: Payer: Self-pay | Admitting: Nutrition

## 2018-01-28 DIAGNOSIS — C07 Malignant neoplasm of parotid gland: Secondary | ICD-10-CM | POA: Diagnosis not present

## 2018-01-28 DIAGNOSIS — Z510 Encounter for antineoplastic radiation therapy: Secondary | ICD-10-CM | POA: Diagnosis not present

## 2018-01-28 NOTE — Progress Notes (Signed)
Patient did not show up for nutrition appointment. 

## 2018-01-29 ENCOUNTER — Encounter: Payer: Self-pay | Admitting: *Deleted

## 2018-01-29 ENCOUNTER — Ambulatory Visit
Admission: RE | Admit: 2018-01-29 | Discharge: 2018-01-29 | Disposition: A | Discharge status: Home / Self Care | Payer: BLUE CROSS/BLUE SHIELD | Source: Ambulatory Visit | Attending: Radiation Oncology | Admitting: Radiation Oncology

## 2018-01-29 DIAGNOSIS — Z510 Encounter for antineoplastic radiation therapy: Secondary | ICD-10-CM | POA: Diagnosis not present

## 2018-01-29 DIAGNOSIS — C07 Malignant neoplasm of parotid gland: Secondary | ICD-10-CM | POA: Diagnosis not present

## 2018-01-29 NOTE — Telephone Encounter (Signed)
Oncology Nurse Navigator Documentation  Received call from [place], [place] Risk.  She indicated Mr. [place]' Short Term Disability is currently active thru August 26.  She will contact Mr. Trow before expiration for documentation if extension needed.  Gayleen Orem, RN, BSN Head & Neck Oncology Nurse [place] at [place] [phone]

## 2018-01-29 NOTE — Progress Notes (Signed)
Oncology Nurse Navigator Documentation  Met with pt during final RT to offer support and to celebrate end of radiation treatment.   Provided verbal/written post-RT guidance:  Importance of keeping all follow-up appts, especially with Nutrition.   Importance of protecting treatment area from sun.  Continuation of Sonafine application 2-3 times daily until supply exhausted after which transition to OTC lotion with vitamin E. Provided appt calendar. Explained my role as navigator will continue for several more months and that I will be calling and/or joining him during follow-up visits.   I encouraged him to call me with needs/concerns.   Patient verbalized understanding of information provided.  Gayleen Orem, RN, BSN Head & Neck Oncology [place] at [place] [phone]

## 2018-01-30 ENCOUNTER — Ambulatory Visit: Payer: BLUE CROSS/BLUE SHIELD

## 2018-02-02 ENCOUNTER — Ambulatory Visit: Payer: BLUE CROSS/BLUE SHIELD

## 2018-02-03 ENCOUNTER — Encounter: Payer: Self-pay | Admitting: Radiation Oncology

## 2018-02-03 NOTE — Progress Notes (Signed)
  Radiation Oncology         [phone]) [phone] ________________________________  Name: Matthew Meyer. 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 Patient experiences fatigue and mild headaches towards the middle of treatment. By the end, he denied fatigue and mild weight loss. He denied pain, issues swallowing throughout. He reported dry mouth, thick white saliva, mouth sores on the left side, skin hyperpigmentation, redness, tightness, and peeling. Patient notes using a baking soda rinse twice a day and sonafine lotion twice a day to alleviate symptoms.  Plan: The patient has completed radiation treatment. The patient will return to radiation oncology clinic for routine followup in one half month. I advised the patient to call or return sooner if any questions or concerns arise that are related to recovery or treatment.  -----------------------------------  Eppie Gibson, MD  This document serves as a record of services personally performed by Eppie Gibson, MD. It was created on his behalf by Wilburn Mylar, a trained medical scribe. The creation of this record is based on the scribe's personal observations and the provider's statements to them. This document has been checked and approved by the attending provider.

## 2018-02-09 ENCOUNTER — Encounter: Payer: Self-pay | Admitting: Radiation Oncology

## 2018-02-09 NOTE — Progress Notes (Signed)
  Matthew Meyer presents for follow up of radiation therapy completed 01/29/18 to his Left Parotid and Left neck.   Pain issues, if any: He denies current pain. He does report pain to the left side of his face when he yawns or stretches his face.  Using a feeding tube?: N/A Weight changes, if any:  Wt Readings from Last 3 Encounters:  02/20/18 200 lb (90.7 kg)  01/19/18 200 lb (90.7 kg)  01/14/18 201 lb 3.2 oz (91.3 kg)   Swallowing issues, if any: He continues to report taste changes. He is drinking one ensure daily. He is eating softer, moistened foods. He is unable to eat drier foods.  Smoking or chewing tobacco? No Using fluoride trays daily? Yes.  Last ENT visit was on: Dr. Constance Holster 01/15/18. He will see Dr. Constance Holster next in October Other notable issues, if any:  His skin has healed well. There is still hyperpigmentation present. He continues to use sonafine to this area.  He will see an eye doctor next week.   BP 124/70 (BP Location: Right Arm)   Pulse (!) 59   Temp 99.1 F (37.3 C) (Oral)   Ht 5\' 8"  (1.727 m)   Wt 200 lb (90.7 kg)   SpO2 99%   BMI 30.41 kg/m

## 2018-02-19 ENCOUNTER — Telehealth: Payer: Self-pay | Admitting: *Deleted

## 2018-02-19 NOTE — Telephone Encounter (Signed)
CALLED PATIENT TO ASK IF HE WOULD BE INTERESTED IN COMING IN EARLIER TOMORROW @ 2:40 PM DUE TO AN OPENING, LVM FOR A RETURN CALL

## 2018-02-20 ENCOUNTER — Other Ambulatory Visit: Payer: Self-pay

## 2018-02-20 ENCOUNTER — Ambulatory Visit
Admission: RE | Admit: 2018-02-20 | Discharge: 2018-02-20 | Disposition: A | Discharge status: Home / Self Care | Payer: BLUE CROSS/BLUE SHIELD | Source: Ambulatory Visit | Attending: Radiation Oncology | Admitting: Radiation Oncology

## 2018-02-20 ENCOUNTER — Encounter: Payer: Self-pay | Admitting: Radiation Oncology

## 2018-02-20 VITALS — BP 124/70 | HR 59 | Temp 99.1°F | Ht 68.0 in | Wt 200.0 lb

## 2018-02-20 DIAGNOSIS — Z79899 Other long term (current) drug therapy: Secondary | ICD-10-CM | POA: Diagnosis not present

## 2018-02-20 DIAGNOSIS — C07 Malignant neoplasm of parotid gland: Secondary | ICD-10-CM

## 2018-02-20 HISTORY — DX: Personal history of irradiation: Z92.3

## 2018-02-20 NOTE — Progress Notes (Signed)
Radiation Oncology         [phone]) [phone] ________________________________  Name: Matthew Meyer. MRN: [id number]  Date: 02/20/2018  DOB: [birth year]  Follow-Up Visit Note  CC: [name], NP  [name], MD  Diagnosis and Prior Radiotherapy:       ICD-10-CM   1. 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 He is doing well.   He last saw Dr. Constance Holster 2 weeks prior to finishing radiation. He is using radiaplex to his radiation site. He follows up with his eye doctor next week for follow up regarding surgical procedure to aid with eyelid drooping.   On review of systems, he reports dry mouth in the morning, improved PO intake, and gag reflex with swallowing that has since resolved. he denies any other symptoms. Pertinent positives are listed and detailed within the above HPI.                      ALLERGIES:  is allergic to bee pollen and pollen extract.  Meds: Current Outpatient Medications  Medication Sig Dispense Refill  . atenolol (TENORMIN) 50 MG tablet Take 1 tablet (50 mg total) by mouth daily. 90 tablet 3  . calcium carbonate (OS-CAL - DOSED IN MG OF ELEMENTAL CALCIUM) 1250 (500 Ca) MG tablet Take 1 tablet by mouth daily.    . cholecalciferol (VITAMIN D) 1000 units tablet Take 1,000 Units by mouth daily.    Marland Kitchen lisinopril-hydrochlorothiazide (PRINZIDE,ZESTORETIC) 20-25 MG  tablet Take 1 tablet by mouth daily. 90 tablet 3  . Multiple Vitamins-Minerals (MULTIVITAMIN WITH MINERALS) tablet Take 1 tablet by mouth daily.    . pantoprazole (PROTONIX) 40 MG tablet Take 1 tablet (40 mg total) by mouth daily. (Patient taking differently: Take 40 mg by mouth at bedtime. ) 90 tablet 3  . sodium fluoride (FLUORISHIELD) 1.1 % GEL dental gel Instill one drop of gel per tooth space of fluoride tray. Place over teeth for 5 minutes. Remove. Spit out excess. Repeat nightly. 120 mL prn  . vitamin E 400 UNIT capsule Take 400 Units by mouth daily.    Marland Kitchen HYDROcodone-acetaminophen (NORCO) 7.5-325 MG tablet Take 1 tablet by mouth every 6 (six) hours as needed for moderate pain. (Patient not taking: Reported on 02/20/2018) 30 tablet 0  . promethazine (PHENERGAN) 25 MG suppository Place 1 suppository (25 mg total) rectally every 6 (six) hours as needed for nausea or vomiting. (Patient not taking: Reported on 11/18/2017) 12 suppository 1   No current facility-administered medications for this encounter.     Physical Findings: The patient is in no acute distress. Patient is alert and oriented. Wt Readings from Last 3 Encounters:  02/20/18 200 lb (90.7 kg)  01/19/18 200 lb (90.7 kg)  01/14/18 201 lb 3.2 oz (91.3 kg)    height is 5\' 8"  (1.727 m) and weight is 200 lb (90.7  kg). His oral temperature is 99.1 F (37.3 C). His blood pressure is 124/70 and his pulse is 59 (abnormal). His oxygen saturation is 99%. .  General: Alert and oriented, in no acute distress HEENT: Head is normocephalic. Extraocular movements are intact. Oropharynx is notable for mucus membranes clear. Swelling of the left buccal mucosa noted.  Neck: Neck is notable for post operative fibrosis under the surgical scar in the upper left neck. No palpable nodes in the cervical, supraclavicular neck.  Skin: Skin in treatment fields shows satisfactory healing. Areas of skin graft in the parotid bed on the left is still  hyperpigmented with crusting dry desquamation.  Lymphatics: see Neck Exam Psychiatric: Judgment and insight are intact. Affect is appropriate.   Lab Findings: Lab Results  Component Value Date   WBC 6.0 11/19/2017   HGB 12.2 (L) 11/19/2017   HCT 36.7 (L) 11/19/2017   MCV 85.0 11/19/2017   PLT 341 11/19/2017    Lab Results  Component Value Date   TSH 1.491 10/31/2017    Radiographic Findings: No results found.  Impression/Plan:    1) Head and Neck Cancer Status: Healing well from radiotherapy  2) Nutritional Status: stable  Wt Readings from Last 3 Encounters:  02/20/18 200 lb (90.7 kg)  01/19/18 200 lb (90.7 kg)  01/14/18 201 lb 3.2 oz (91.3 kg)    3) Risk Factors: The patient has been educated about risk factors including alcohol and tobacco abuse; they understand that avoidance of alcohol and tobacco is important to prevent recurrences as well as other cancers  4) Swallowing: Functional, does report thick saliva.   5) Dental: Encouraged to continue regular followup with dentistry, and dental hygiene including fluoride rinses.   6) Thyroid function: unlikely to be affected by RT but reasonable to screen yearly Lab Results  Component Value Date   TSH 1.491 10/31/2017    7) Other: I discussed with the patient that I do not foresee any issues with him following up with Dr. Kristeen Miss for possible reconstruction of his left eyelid.   I advised the pt to use vitamin E oil or lotions with vitamin E oil to aid in alleviating radiation related skin changes to the left parotid area.  8) Follow-up in Radiation Oncology at the end of October 2019 with CT imaging. The patient was encouraged to call with any issues or questions before then.  I spent 15 minutes minutes face to face with the patient and more than 50% of that time was spent in counseling and/or coordination of care.   _____________________________________   Eppie Gibson, MD  This document serves as a record of  services personally performed by Eppie Gibson, MD. It was created on her behalf by Steva Colder, a trained medical scribe. The creation of this record is based on the scribe's personal observations and the provider's statements to them. This document has been checked and approved by the attending provider.

## 2018-02-24 ENCOUNTER — Other Ambulatory Visit: Payer: Self-pay | Admitting: Radiation Oncology

## 2018-02-24 ENCOUNTER — Encounter: Payer: Self-pay | Admitting: Radiation Oncology

## 2018-02-24 DIAGNOSIS — Z01818 Encounter for other preprocedural examination: Secondary | ICD-10-CM | POA: Diagnosis not present

## 2018-02-24 DIAGNOSIS — C760 Malignant neoplasm of head, face and neck: Secondary | ICD-10-CM

## 2018-02-24 DIAGNOSIS — H02035 Senile entropion of left lower eyelid: Secondary | ICD-10-CM | POA: Diagnosis not present

## 2018-02-26 ENCOUNTER — Encounter: Payer: Self-pay | Admitting: *Deleted

## 2018-02-26 NOTE — Progress Notes (Signed)
Oncology Nurse Navigator Documentation  STD extension paperwork rec'd 8/20 by this navigator delivered to [employer].  Gayleen Orem, RN, BSN Head & Neck Oncology Nurse [place] at [place] [phone]

## 2018-03-03 ENCOUNTER — Encounter (HOSPITAL_COMMUNITY): Payer: Self-pay | Admitting: Dentistry

## 2018-03-03 ENCOUNTER — Ambulatory Visit (HOSPITAL_COMMUNITY): Payer: Self-pay | Admitting: Dentistry

## 2018-03-03 VITALS — BP 109/73 | HR 54 | Temp 98.3°F | Wt 200.0 lb

## 2018-03-03 DIAGNOSIS — K117 Disturbances of salivary secretion: Secondary | ICD-10-CM

## 2018-03-03 DIAGNOSIS — R682 Dry mouth, unspecified: Secondary | ICD-10-CM

## 2018-03-03 DIAGNOSIS — Z923 Personal history of irradiation: Secondary | ICD-10-CM

## 2018-03-03 DIAGNOSIS — C07 Malignant neoplasm of parotid gland: Secondary | ICD-10-CM | POA: Diagnosis not present

## 2018-03-03 DIAGNOSIS — K1233 Oral mucositis (ulcerative) due to radiation: Secondary | ICD-10-CM

## 2018-03-03 DIAGNOSIS — R432 Parageusia: Secondary | ICD-10-CM

## 2018-03-03 NOTE — Patient Instructions (Signed)
RECOMMENDATIONS: 1. Brush after meals and at bedtime.  Use fluoride at bedtime. 2. Use trismus exercises as directed. 3. Use Biotene rinse or salt water/baking soda rinses. 4. Multiple sips of water as needed. 5. Return to the primary dentist of his choice in 2-3 months for continued comprehensive dental care and periodontal therapy after radiation therapy.   Lenn Cal, DDS

## 2018-03-03 NOTE — Progress Notes (Signed)
03/03/2018  Patient Name:   Matthew Meyer Matthew Meyer. Date of Birth:   Nov 28, 1955 Medical Record Number: [id number]  BP 109/73 (BP Location: Right Arm)   Pulse (!) 54   Temp 98.3 F (36.8 C)   Wt 200 lb (90.7 kg)   BMI 30.41 kg/m   [name] presents for oral examination after radiation therapy. Patient has completed all radiation treatments from 12/15/2017 through 01/29/2018. No chemotherapy.  REVIEW OF CHIEF COMPLAINTS: DRY MOUTH: Yes HARD TO SWALLOW: No  HURT TO SWALLOW: No TASTE CHANGES: Taste is returning slowly. SORES IN MOUTH: Yes-lower left buccal mucosa TRISMUS: no significant symptoms. WEIGHT: 200 pounds and stable. Was 220 pounds at start of treatment.  HOME OH REGIMEN:  BRUSHING: Twice a day. Patient encouraged to brush after meals and at bedtime. FLOSSING: Once a day RINSING: Patient is using salt water/baking soda rinses and using Biotene rinses. FLUORIDE:  Patient uses fluoride at bedtime. TRISMUS EXERCISES:  Maximum interincisal opening: 50 mm.-same as preXRT. Patient was encouraged to use trismus exercises daily.   DENTAL EXAM:  Oral Hygiene:(PLAQUE): Good oral hygiene. Patient encouraged to brush teeth after meals and at bedtime. LOCATION OF MUCOSITIS: left posterior buccal mucosa is erythematous. DESCRIPTION OF SALIVA: Decreased saliva. Mild xerostomia. ANY EXPOSED BONE: None noted. OTHER WATCHED AREAS:   Previous extraction sites area #14 and 15.  Eye lid and facial drooping. DX:  1. Xerostomia 2. Dysgeusia 3. Mucositis   RECOMMENDATIONS: 1. Brush after meals and at bedtime.  Use fluoride at bedtime. 2. Use trismus exercises as directed. 3. Use Biotene rinse or salt water/baking soda rinses. 4. Multiple sips of water as needed. 5. Return to the primary dentist of his choice in 2-3 months for continued comprehensive dental care and periodontal therapy after radiation therapy.   Lenn Cal, DDS

## 2018-03-05 MED FILL — FLUORISHIELD 1.1% GEL: 1.1 % | 28 days supply | Qty: 114 | Fill #1

## 2018-03-05 NOTE — Progress Notes (Signed)
Extension paperwork completed and successfully faxed to [facility] at [phone]. Mailed copy to patient address on file.

## 2018-03-25 DIAGNOSIS — H02106 Unspecified ectropion of left eye, unspecified eyelid: Secondary | ICD-10-CM | POA: Diagnosis not present

## 2018-03-25 DIAGNOSIS — H02135 Senile ectropion of left lower eyelid: Secondary | ICD-10-CM | POA: Diagnosis not present

## 2018-03-25 DIAGNOSIS — H02035 Senile entropion of left lower eyelid: Secondary | ICD-10-CM | POA: Diagnosis not present

## 2018-04-09 DIAGNOSIS — H6982 Other specified disorders of Eustachian tube, left ear: Secondary | ICD-10-CM | POA: Diagnosis not present

## 2018-04-09 DIAGNOSIS — H90A21 Sensorineural hearing loss, unilateral, right ear, with restricted hearing on the contralateral side: Secondary | ICD-10-CM | POA: Diagnosis not present

## 2018-04-09 DIAGNOSIS — H6522 Chronic serous otitis media, left ear: Secondary | ICD-10-CM | POA: Diagnosis not present

## 2018-04-09 DIAGNOSIS — H9072 Mixed conductive and sensorineural hearing loss, unilateral, left ear, with unrestricted hearing on the contralateral side: Secondary | ICD-10-CM | POA: Diagnosis not present

## 2018-04-09 DIAGNOSIS — H90A32 Mixed conductive and sensorineural hearing loss, unilateral, left ear with restricted hearing on the contralateral side: Secondary | ICD-10-CM | POA: Diagnosis not present

## 2018-04-29 ENCOUNTER — Ambulatory Visit: Payer: BLUE CROSS/BLUE SHIELD

## 2018-05-01 ENCOUNTER — Ambulatory Visit (HOSPITAL_COMMUNITY): Payer: BLUE CROSS/BLUE SHIELD

## 2018-05-04 ENCOUNTER — Ambulatory Visit
Admission: RE | Admit: 2018-05-04 | Discharge: 2018-05-04 | Disposition: A | Discharge status: Home / Self Care | Payer: BLUE CROSS/BLUE SHIELD | Source: Ambulatory Visit | Attending: Radiation Oncology | Admitting: Radiation Oncology

## 2018-05-04 NOTE — Progress Notes (Signed)
Mr. Fitchett presents for follow up of radiation completed 01/29/18 to his left parotid and left neck.  Pain issues, if any: He reports lower back pain since starting back to work.  Using a feeding tube?: N/A Weight changes, if any:  Wt Readings from Last 3 Encounters:  05/08/18 196 lb (88.9 kg)  03/03/18 200 lb (90.7 kg)  02/20/18 200 lb (90.7 kg)   Swallowing issues, if any: He reports a dry mouth in the morning. He drinks water and uses gravies to help swallow certain foods.  Smoking or chewing tobacco? No Using fluoride trays daily? He is using about 5 times weekly.  Last ENT visit was on: Dr. Constance Holster 05/06/18 for hearing difficulties to his left ear. He will see him again 05/22/18 to have his hearing checked again.  Other notable issues, if any:  CT chest/ neck 05/05/18  BP 137/79 (BP Location: Left Arm, Patient Position: Sitting)   Pulse (!) 57   Temp 98 F (36.7 C) (Oral)   Resp 18   Ht 5\' 8"  (1.727 m)   Wt 196 lb (88.9 kg)   SpO2 100%   BMI 29.80 kg/m

## 2018-05-05 ENCOUNTER — Ambulatory Visit
Admission: RE | Admit: 2018-05-05 | Discharge: 2018-05-05 | Disposition: A | Discharge status: Home / Self Care | Payer: BLUE CROSS/BLUE SHIELD | Source: Ambulatory Visit | Attending: Radiation Oncology | Admitting: Radiation Oncology

## 2018-05-05 ENCOUNTER — Encounter (HOSPITAL_COMMUNITY): Payer: Self-pay

## 2018-05-05 ENCOUNTER — Ambulatory Visit (HOSPITAL_COMMUNITY)
Admission: RE | Admit: 2018-05-05 | Discharge: 2018-05-05 | Disposition: A | Discharge status: Home / Self Care | Payer: BLUE CROSS/BLUE SHIELD | Source: Ambulatory Visit | Attending: Radiation Oncology | Admitting: Radiation Oncology

## 2018-05-05 DIAGNOSIS — C760 Malignant neoplasm of head, face and neck: Secondary | ICD-10-CM | POA: Insufficient documentation

## 2018-05-05 DIAGNOSIS — C07 Malignant neoplasm of parotid gland: Secondary | ICD-10-CM | POA: Diagnosis not present

## 2018-05-05 DIAGNOSIS — I700 Atherosclerosis of aorta: Secondary | ICD-10-CM | POA: Insufficient documentation

## 2018-05-05 LAB — BUN & CREATININE (CHCC)
BUN: 11 mg/dL (ref 6–20)
Creatinine: 1.22 mg/dL (ref 0.61–1.24)
GFR, Est AFR Am: >60 mL/min (ref >60)

## 2018-05-05 MED ORDER — IOHEXOL 300 MG/ML  SOLN
75.0000 mL | Freq: Once | INTRAMUSCULAR | Status: AC | PRN
Start: 2018-05-05 — End: 2018-05-05
  Administered 2018-05-05: 75 mL via INTRAVENOUS

## 2018-05-05 MED ORDER — SODIUM CHLORIDE 0.9 % IJ SOLN
INTRAMUSCULAR | Status: AC
  Filled 2018-05-05: qty 50

## 2018-05-06 DIAGNOSIS — H9072 Mixed conductive and sensorineural hearing loss, unilateral, left ear, with unrestricted hearing on the contralateral side: Secondary | ICD-10-CM | POA: Diagnosis not present

## 2018-05-07 DIAGNOSIS — H903 Sensorineural hearing loss, bilateral: Secondary | ICD-10-CM | POA: Diagnosis not present

## 2018-05-07 NOTE — Progress Notes (Signed)
Radiation Oncology         [phone]) [phone] ________________________________  Name: Matthew Meyer. MRN: [id number]  Date: 05/08/2018  DOB: 06-30-1956  Follow-Up Visit Note  CC: [name], NP  [name], MD  Diagnosis and Prior Radiotherapy:       ICD-10-CM   1. 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 Most recent CT imaging from 05/05/2018 includes Neck CT which showed post-treatment changes to the left face and neck without evidence for residual or recurrent tumor. Previously seen left level 2 lymph node is no longer visualized. Chest CT was negative for thoracic metastasis.  Pain issues, if any: He reports mild lower back pain since starting back to work. Deconditioning.  He reports a dry mouth in the morning. He drinks water and uses gravies to help swallow certain foods.  Smoking or chewing tobacco? No Using fluoride trays daily? He is using about 5 times weekly.  Last ENT visit was on: Dr. Constance Holster 05/06/18 for hearing difficulties to his left ear. He will see him again 05/22/18 to have his hearing checked again.  Mastoid effusion noted on CT.    ALLERGIES:  is allergic to bee pollen and pollen extract.  Meds: Current Outpatient Medications  Medication Sig Dispense Refill  . acetaminophen (TYLENOL) 325 MG tablet Take 650 mg by mouth every 6 (six) hours as needed.    Marland Kitchen  atenolol (TENORMIN) 50 MG tablet Take 1 tablet (50 mg total) by mouth daily. 90 tablet 3  . calcium carbonate (OS-CAL - DOSED IN MG OF ELEMENTAL CALCIUM) 1250 (500 Ca) MG tablet Take 1 tablet by mouth daily.    . cholecalciferol (VITAMIN D) 1000 units tablet Take 1,000 Units by mouth daily.    Marland Kitchen lisinopril-hydrochlorothiazide (PRINZIDE,ZESTORETIC) 20-25 MG tablet Take 1 tablet by mouth daily. 90 tablet 3  . Multiple Vitamins-Minerals (MULTIVITAMIN WITH MINERALS) tablet Take 1 tablet by mouth daily.    . pantoprazole (PROTONIX) 40 MG tablet Take 1 tablet (40 mg total) by mouth daily. (Patient taking differently: Take 40 mg by mouth at bedtime. ) 90 tablet 3  . sodium fluoride (FLUORISHIELD) 1.1 % GEL dental gel Instill one drop of gel per tooth space of fluoride tray. Place over teeth for 5 minutes. Remove. Spit out excess. Repeat nightly. 120 mL prn  . vitamin E 400 UNIT capsule Take 400 Units by mouth daily.    Marland Kitchen HYDROcodone-acetaminophen (NORCO) 7.5-325 MG tablet Take 1 tablet by mouth every 6 (six) hours as needed for moderate pain. (Patient not taking: Reported on 02/20/2018) 30 tablet 0  . promethazine (PHENERGAN) 25 MG suppository Place 1 suppository (25 mg total) rectally every 6 (six) hours as needed for nausea or vomiting. (Patient not taking: Reported on 11/18/2017) 12 suppository 1   No current facility-administered medications for this encounter.     Physical Findings: Wt Readings from Last 3  Encounters:  05/08/18 196 lb (88.9 kg)  03/03/18 200 lb (90.7 kg)  02/20/18 200 lb (90.7 kg)    height is 5' 8"  (1.727 m) and weight is 196 lb (88.9 kg). His oral temperature is 98 F (36.7 C). His blood pressure is 137/79 and his pulse is 57 (abnormal). His respiration is 18 and oxygen saturation is 100%. .  General: Alert and oriented, in no acute distress. HEENT: left facial droop. Oropharynx clear Neck: no masses palpated Heart: Regular in rate and rhythm with no murmurs, rubs, or  gallops. Chest: Clear to auscultation bilaterally, with no rhonchi, wheezes, or rales. Abdomen: Soft, nontender, nondistended, with no rigidity or guarding. Extremities: No cyanosis or edema. Lymphatics: see Neck Exam Skin:treatment field/surgical region has healed nicely Musculoskeletal: Symmetric strength and muscle tone throughout. Psychiatric: Judgment and insight are intact. Affect is appropriate.   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 EXAM: CT NECK WITH CONTRAST TECHNIQUE: Multidetector CT imaging of the neck was performed using the standard protocol following the bolus administration of intravenous contrast. CONTRAST:  59m OMNIPAQUE IOHEXOL 300 MG/ML  SOLN COMPARISON:  CT of the neck 10/16/2017 FINDINGS: Pharynx and larynx: Edematous changes are present on the left side of the oropharynx and extending along the peri epiglottic fold. Vocal cords are midline and symmetric. No focal mucosal or submucosal lesions are present. Tongue base is otherwise unremarkable. Salivary glands: The left parotid gland this resected. The right parotid gland is within normal limits. Submandibular glands are unremarkable. There is some inflammatory change about the left submandibular gland. Thyroid: Normal. Lymph nodes: No significant cervical adenopathy is present. Previously noted left level 2 lymph node is no longer measurable. Vascular: Atherosclerotic calculi dislocations are present at the carotid bifurcations bilaterally without significant stenosis. There is edema within the left carotid space, likely related to radiation. Limited  intracranial: Within normal limits. Visualized orbits: Not imaged. Mastoids and visualized paranasal sinuses: Mild mucosal thickening is present along the floor of the maxillary sinuses bilaterally. There is a left mastoid effusion. The visualized sphenoid sinuses are clear. Skeleton: Mild degenerative changes are present. No focal lytic or blastic lesions are evident. Upper chest: The lung apices are clear. No focal nodule, mass, or airspace disease is present. Other: Postsurgical changes are noted along the lateral margin of the left masticator space. No residual or recurrent tumor is evident. There is thickening along the left platysma and subcutaneous inflammatory changes compatible with recent radiation. IMPRESSION: 1. Post treatment changes to the left face and neck without evidence for residual or recurrent tumor. 2. Atherosclerotic changes are present at the carotid bifurcations bilaterally without definite stenosis. 3. Previously seen left level 2 lymph node is no longer visualized. 4. Left mastoid effusion likely secondary to radiation. Electronically Signed   By: CSan MorelleM.D.   On: 05/05/2018 13:39   Ct Chest W Contrast  Result Date: 05/05/2018 CLINICAL DATA:  Head neck carcinoma. Lung met screening. Parotid/salivary gland tumor. EXAM: CT CHEST WITH CONTRAST TECHNIQUE: Multidetector CT imaging of the chest was performed during intravenous contrast administration. CONTRAST:  25m OMNIPAQUE IOHEXOL 300 MG/ML  SOLN COMPARISON:  CT 11/06/2017 FINDINGS: Cardiovascular: Coronary artery calcification and aortic atherosclerotic calcification. Mediastinum/Nodes: No axillary or supraclavicular adenopathy. No mediastinal adenopathy. No pericardial effusion. Esophagus normal. Lungs/Pleura: No suspicious pulmonary nodules.  Airways normal. Upper Abdomen: Limited view of the liver, kidneys, pancreas are unremarkable. Normal adrenal glands. Musculoskeletal: No aggressive osseous lesion IMPRESSION: 1.  No evidence of thoracic metastasis. 2.  Aortic Atherosclerosis (ICD10-I70.0). Electronically Signed   By: SSuzy BouchardM.D.   On: 05/05/2018 15:39    Impression/Plan:    1) Head and Neck Cancer Status: NED  2) Nutritional Status: stable  Wt Readings from Last 3 Encounters:  05/08/18 196 lb (88.9 kg)  03/03/18 200 lb (90.7 kg)  02/20/18 200 lb (90.7 kg)   3) Risk Factors: The patient has been educated about risk factors including alcohol and tobacco abuse; they understand that avoidance of alcohol and tobacco is important to prevent recurrences as well as other cancers  4) Swallowing: Functional, does report dry mouth.  Suggest trying xylimelts and continued sips of water and biotene products  5) Dental: Encouraged to continue regular followup with dentistry, and dental hygiene including fluoride rinses.   6) Thyroid function: unlikely to be affected by RT but reasonable to screen yearly Lab Results  Component Value Date   TSH 1.491 10/31/2017    7) Follow-up in Radiation Oncology in 6 mo. The patient was encouraged to call with any issues or questions before then.  Will review his scans at ENT board  Patient was given an informational document that our care team curated for head and neck cancer survivors to help with symptom management,and overall post-treatment wellness. Phone number of RGayleen Orem RN, our Head and Neck Oncology Navigator was provided in case any questions arise. [facility].    I spent 20 minutes face to face with the patient and more than 50% of that time was spent in counseling and/or coordination of care.   _____________________________________   SEppie Gibson MD  This document serves as a record of services personally performed by SEppie Gibson MD. It was created on her behalf by HRae Lips a trained medical scribe. The creation of this record is based on the scribe's personal observations and the  provider's statements to them. This document has been checked and approved by the attending provider.

## 2018-05-08 ENCOUNTER — Ambulatory Visit
Admission: RE | Admit: 2018-05-08 | Discharge: 2018-05-08 | Disposition: A | Discharge status: Home / Self Care | Payer: BLUE CROSS/BLUE SHIELD | Source: Ambulatory Visit | Attending: Radiation Oncology | Admitting: Radiation Oncology

## 2018-05-08 ENCOUNTER — Encounter: Payer: Self-pay | Admitting: Radiation Oncology

## 2018-05-08 ENCOUNTER — Telehealth: Payer: Self-pay | Admitting: *Deleted

## 2018-05-08 ENCOUNTER — Other Ambulatory Visit: Payer: Self-pay

## 2018-05-08 VITALS — BP 137/79 | HR 57 | Temp 98.0°F | Resp 18 | Ht 68.0 in | Wt 196.0 lb

## 2018-05-08 DIAGNOSIS — Z79899 Other long term (current) drug therapy: Secondary | ICD-10-CM | POA: Diagnosis not present

## 2018-05-08 DIAGNOSIS — Z923 Personal history of irradiation: Secondary | ICD-10-CM | POA: Insufficient documentation

## 2018-05-08 DIAGNOSIS — Z08 Encounter for follow-up examination after completed treatment for malignant neoplasm: Secondary | ICD-10-CM | POA: Diagnosis not present

## 2018-05-08 DIAGNOSIS — C07 Malignant neoplasm of parotid gland: Secondary | ICD-10-CM | POA: Insufficient documentation

## 2018-05-08 DIAGNOSIS — Z1329 Encounter for screening for other suspected endocrine disorder: Secondary | ICD-10-CM

## 2018-05-08 DIAGNOSIS — Z85858 Personal history of malignant neoplasm of other endocrine glands: Secondary | ICD-10-CM | POA: Diagnosis not present

## 2018-05-08 DIAGNOSIS — R682 Dry mouth, unspecified: Secondary | ICD-10-CM | POA: Diagnosis not present

## 2018-05-08 NOTE — Telephone Encounter (Signed)
CALLED PATIENT TO INFORM OF FU FOR MAY 1 @ 10 AM, LVM FOR A RETURN CALL

## 2018-05-11 ENCOUNTER — Telehealth: Payer: Self-pay | Admitting: *Deleted

## 2018-05-11 NOTE — Telephone Encounter (Signed)
CALLED PATIENT TO INFORM OF LAB AND FU APPT. WITH DR. Isidore Moos ON 11-05-17, LVM FOR A RETURN CALL

## 2018-05-13 ENCOUNTER — Telehealth: Payer: Self-pay | Admitting: *Deleted

## 2018-05-13 NOTE — Telephone Encounter (Signed)
Oncology Nurse Navigator Documentation  In follow-up to patient's expressed interest in [facility] program during his 11/1 post-tmt appt with Dr. Isidore Moos, faxed Medical Clearance form signed by Dr. Isidore Moos to Heron Nay, program coordinator.  Notification of successful fax transmission received.  Spoke with Mr. Dalby, informed him of above.   He confirmed he has had follow-up with ENT Dr. Constance Holster, sees him again 11/15.  Gayleen Orem, RN, BSN Head & Neck Oncology [facility] [phone]

## 2018-06-22 ENCOUNTER — Ambulatory Visit (INDEPENDENT_AMBULATORY_CARE_PROVIDER_SITE_OTHER): Payer: BLUE CROSS/BLUE SHIELD | Admitting: Adult Health

## 2018-06-22 VITALS — BP 124/76 | Temp 98.2°F | Ht 67.5 in | Wt 198.5 lb

## 2018-06-22 DIAGNOSIS — C07 Malignant neoplasm of parotid gland: Secondary | ICD-10-CM

## 2018-06-22 DIAGNOSIS — I10 Essential (primary) hypertension: Secondary | ICD-10-CM | POA: Diagnosis not present

## 2018-06-22 DIAGNOSIS — Z0000 Encounter for general adult medical examination without abnormal findings: Secondary | ICD-10-CM

## 2018-06-22 DIAGNOSIS — Z23 Encounter for immunization: Secondary | ICD-10-CM

## 2018-06-22 DIAGNOSIS — R6882 Decreased libido: Secondary | ICD-10-CM

## 2018-06-22 DIAGNOSIS — Z114 Encounter for screening for human immunodeficiency virus [HIV]: Secondary | ICD-10-CM

## 2018-06-22 DIAGNOSIS — Z1159 Encounter for screening for other viral diseases: Secondary | ICD-10-CM

## 2018-06-22 LAB — HEPATIC FUNCTION PANEL
ALBUMIN: 4.5 g/dL (ref 3.5–5.2)
ALK PHOS: 38 U/L — AB (ref 39–117)
ALT: 22 U/L (ref 0–53)
AST: 21 U/L (ref 0–37)
BILIRUBIN DIRECT: 0.2 mg/dL (ref 0.0–0.3)
Total Bilirubin: 0.9 mg/dL (ref 0.2–1.2)
Total Protein: 7.3 g/dL (ref 6.0–8.3)

## 2018-06-22 LAB — CBC WITH DIFFERENTIAL/PLATELET
BASOS ABS: 0 10*3/uL (ref 0.0–0.1)
BASOS PCT: 1.2 % (ref 0.0–3.0)
EOS ABS: 0.1 10*3/uL (ref 0.0–0.7)
Eosinophils Relative: 3.5 % (ref 0.0–5.0)
HCT: 39.1 % (ref 39.0–52.0)
Hemoglobin: 13.1 g/dL (ref 13.0–17.0)
Lymphocytes Relative: 10.4 % — ABNORMAL LOW (ref 12.0–46.0)
Lymphs Abs: 0.4 10*3/uL — ABNORMAL LOW (ref 0.7–4.0)
MCHC: 33.5 g/dL (ref 30.0–36.0)
MCV: 90.5 fl (ref 78.0–100.0)
Monocytes Absolute: 0.3 10*3/uL (ref 0.1–1.0)
Monocytes Relative: 7.1 % (ref 3.0–12.0)
NEUTROS ABS: 3.1 10*3/uL (ref 1.4–7.7)
NEUTROS PCT: 77.8 % — AB (ref 43.0–77.0)
PLATELETS: 197 10*3/uL (ref 150.0–400.0)
RBC: 4.32 Mil/uL (ref 4.22–5.81)
RDW: 14.8 % (ref 11.5–15.5)
WBC: 4 10*3/uL (ref 4.0–10.5)

## 2018-06-22 LAB — TSH: TSH: 1.01 u[IU]/mL (ref 0.35–4.50)

## 2018-06-22 LAB — LIPID PANEL
CHOL/HDL RATIO: 3
Cholesterol: 173 mg/dL (ref 0–200)
HDL: 60.4 mg/dL (ref >39.00)
LDL Cholesterol: 101 mg/dL — ABNORMAL HIGH (ref 0–99)
NONHDL: 112.55
TRIGLYCERIDES: 60 mg/dL (ref 0.0–149.0)
VLDL: 12 mg/dL (ref 0.0–40.0)

## 2018-06-22 LAB — BASIC METABOLIC PANEL
BUN: 18 mg/dL (ref 6–23)
CALCIUM: 10.1 mg/dL (ref 8.4–10.5)
CO2: 30 meq/L (ref 19–32)
CREATININE: 1.13 mg/dL (ref 0.40–1.50)
Chloride: 100 mEq/L (ref 96–112)
GFR: 84.81 mL/min (ref >60.00)
GLUCOSE: 110 mg/dL — AB (ref 70–99)
Potassium: 4.1 mEq/L (ref 3.5–5.1)
Sodium: 137 mEq/L (ref 135–145)

## 2018-06-22 LAB — PSA: PSA: 0.65 ng/mL (ref 0.10–4.00)

## 2018-06-22 NOTE — Progress Notes (Signed)
Subjective:    Patient ID: Matthew Heads., male    DOB: Dec 20, 1955, 62 y.o.   MRN: [id number]  HPI  Patient presents for yearly preventative medicine examination. He is a pleasant 62 year old male who  has a past medical history of Cancer ([place]), GERD (gastroesophageal reflux disease), History of radiation therapy (12/15/17- 01/29/18), and Hypertension.  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  He has been followed up with ENT and radiation oncology and reports that he has been deemed cancer free.  10 used to have some trouble with eating mostly sticks to moist soft foods.  Has no trouble swallowing or taking medications.  All immunizations and health maintenance protocols were reviewed with the patient and needed orders were placed. Needs flu   Appropriate screening laboratory values were ordered for the patient including screening of hyperlipidemia, renal function and hepatic function. If indicated by BPH, a PSA was ordered.  Medication reconciliation,  past medical history, social history, problem list and allergies were reviewed in detail with the patient  Goals were established with regard to weight loss, exercise, and  diet in compliance with medications. He has been working on a [employer] Readings from Last 3 Encounters:  06/22/18 198 lb 8 oz (90 kg)  05/08/18 196 lb (88.9 kg)  03/03/18 200 lb (90.7 kg)   End of life planning was discussed.  Review of Systems  Constitutional: Negative.   HENT: Positive for drooling and hearing loss. Negative for trouble swallowing and voice change.   Eyes: Negative.   Respiratory: Negative.   Cardiovascular: Negative.   Gastrointestinal: Negative.     Endocrine: Negative.   Genitourinary: Negative.   Musculoskeletal: Negative.   Allergic/Immunologic: Negative.   Neurological: Negative.   Hematological: Negative.   Psychiatric/Behavioral: Negative.   All other systems reviewed and are negative.  Past Medical History:  Diagnosis Date  . Cancer (HCC)    Parotid cancer-Left  . GERD (gastroesophageal reflux disease)   . History of radiation therapy 12/15/17- 01/29/18   Left Parotid and Left Neck 66 Gy total for 33 fractions with lower doses to at risk surrounding tissues.   . Hypertension     Social History   Socioeconomic History  . Marital status: Single    Spouse name: Not on file  . Number of children: Not on file  . Years of education: Not on file  . Highest education level: Not on file  Occupational History  . Not on file  Social Needs  . Financial resource strain: Not on file  . Food insecurity:    Worry: Not on file    Inability: Not on file  . Transportation needs:    Medical: No    Non-medical: No  Tobacco Use  . Smoking status: Never Smoker  . Smokeless tobacco: Never Used  Substance and Sexual Activity  . Alcohol use: Not Currently  . Drug use: No  . Sexual activity: Not Currently  Lifestyle  . Physical activity:    Days per week: Not on file    Minutes per session: Not on file  . Stress: Not on file  Relationships  . Social connections:    Talks on phone: Not on file  Gets together: Not on file    Attends religious service: Not on file    Active member of club or organization: Not on file    Attends meetings of clubs or organizations: Not on file    Relationship status: Not on file  . Intimate partner violence:    Fear of current or ex partner: No    Emotionally abused: No    Physically abused: No    Forced sexual activity: No  Other Topics Concern  . Not on file  Social History Narrative  . Not on file    Past Surgical History:  Procedure Laterality Date  . APPENDECTOMY     2nd  grade  . LEG SURGERY Left    surgery for a broken leg when he was 62 years old   . MULTIPLE EXTRACTIONS WITH ALVEOLOPLASTY N/A 11/20/2017   Procedure: Extaction of tooth #'s 14 and 15 with alveoloplasty and gross debridement of remaining teeth;  Surgeon: Lenn Cal, DDS;  Location: [facility];  Service: Oral Surgery;  Laterality: N/A;  . PAROTIDECTOMY Left 11/06/2017   Procedure: PAROTIDECTOMY;  Surgeon: [name], MD;  Location: [facility] OR;  Service: ENT;  Laterality: Left;  Possible skin graft, possible nerve graft Needs RNFA  . RADICAL NECK DISSECTION Left 11/06/2017   Procedure: RADICAL NECK DISSECTION;  Surgeon: [name], MD;  Location: [facility];  Service: ENT;  Laterality: Left;  . SKIN SPLIT GRAFT Left 11/06/2017   Procedure: SKIN GRAFT SPLIT THICKNESS;  Surgeon: [name], MD;  Location: [facility] OR;  Service: ENT;  Laterality: Left;    Family History  Problem Relation Age of Onset  . Arthritis Mother   . Hypertension Mother   . Diabetes Father   . Diabetes Brother     Allergies  Allergen Reactions  . Bee Pollen     Caused sinus infection  . Pollen Extract     Caused sinus infection    Current Outpatient Medications on File Prior to Visit  Medication Sig Dispense Refill  . acetaminophen (TYLENOL) 325 MG tablet Take 650 mg by mouth every 6 (six) hours as needed.    Marland Kitchen atenolol (TENORMIN) 50 MG tablet Take 1 tablet (50 mg total) by mouth daily. 90 tablet 3  . calcium carbonate (OS-CAL - DOSED IN MG OF ELEMENTAL CALCIUM) 1250 (500 Ca) MG tablet Take 1 tablet by mouth daily.    . cholecalciferol (VITAMIN D) 1000 units tablet Take 1,000 Units by mouth daily.    Marland Kitchen HYDROcodone-acetaminophen (NORCO) 7.5-325 MG tablet Take 1 tablet by mouth every 6 (six) hours as needed for moderate pain. 30 tablet 0  . lisinopril-hydrochlorothiazide (PRINZIDE,ZESTORETIC) 20-25 MG tablet Take 1 tablet by mouth daily. 90 tablet 3  . Multiple Vitamins-Minerals (MULTIVITAMIN WITH MINERALS) tablet Take 1  tablet by mouth daily.    . pantoprazole (PROTONIX) 40 MG tablet Take 1 tablet (40 mg total) by mouth daily. (Patient taking differently: Take 40 mg by mouth at bedtime. ) 90 tablet 3  . sodium fluoride (FLUORISHIELD) 1.1 % GEL dental gel Instill one drop of gel per tooth space of fluoride tray. Place over teeth for 5 minutes. Remove. Spit out excess. Repeat nightly. 120 mL prn  . vitamin E 400 UNIT capsule Take 400 Units by mouth daily.     No current facility-administered medications on file prior to visit.     BP 124/76   Temp 98.2 F (36.8 C) (Oral)   Ht 5' 7.5" (1.715 m)   Wt  198 lb 8 oz (90 kg)   BMI 30.63 kg/m       Objective:   Physical Exam Vitals signs and nursing note reviewed.  Constitutional:      General: He is not in acute distress.    Appearance: Normal appearance.  HENT:     Head: Normocephalic and atraumatic.     Right Ear: Tympanic membrane, ear canal and external ear normal. There is no impacted cerumen.     Left Ear: Tympanic membrane, ear canal and external ear normal. There is no impacted cerumen.     Nose: Nose normal. No congestion or rhinorrhea.     Mouth/Throat:     Lips: Pink.     Mouth: Mucous membranes are moist.     Dentition: Abnormal dentition.     Tongue: No lesions. Tongue does not protrude in midline.     Palate: No mass and lesions. Palate does not elevate in midline.     Pharynx: Oropharynx is clear. Uvula midline. No oropharyngeal exudate or posterior oropharyngeal erythema.  Eyes:     Extraocular Movements: Extraocular movements intact.     Conjunctiva/sclera: Conjunctivae normal.     Pupils: Pupils are equal, round, and reactive to light.  Neck:     Musculoskeletal: Normal range of motion and neck supple. No neck rigidity or muscular tenderness.     Vascular: No carotid bruit.  Cardiovascular:     Rate and Rhythm: Normal rate and regular rhythm.     Pulses: Normal pulses.     Heart sounds: Normal heart sounds. No murmur. No  friction rub. No gallop.   Pulmonary:     Effort: Pulmonary effort is normal. No respiratory distress.     Breath sounds: Normal breath sounds. No stridor. No wheezing, rhonchi or rales.  Chest:     Chest wall: No tenderness.  Abdominal:     General: Bowel sounds are normal. There is no distension.     Palpations: Abdomen is soft. There is no mass.     Tenderness: There is no abdominal tenderness. There is no right CVA tenderness, left CVA tenderness, guarding or rebound.     Hernia: No hernia is present.  Genitourinary:    Comments: Will do PSA  Musculoskeletal:        General: Deformity (left side of face/cheek ) present.  Lymphadenopathy:     Cervical: No cervical adenopathy.  Skin:    General: Skin is warm and dry.     Comments: Well healed surgical scar on left face  Neurological:     General: No focal deficit present.     Mental Status: He is alert. Mental status is at baseline. He is disoriented.  Psychiatric:        Mood and Affect: Mood normal.        Behavior: Behavior normal.        Thought Content: Thought content normal.        Judgment: Judgment normal.        Assessment & Plan:  1. Routine general medical examination at a health care facility - Follow up in one year or sooner if needed - Basic metabolic panel - CBC with Differential/Platelet - Hepatic function panel - Lipid panel - TSH - PSA  2. 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 Follow up with ENT, DDS, and Radiation oncology as directed    3. Hypertension, essential - BP well controlled. No change in medications  - Basic metabolic panel - CBC with  Differential/Platelet - Hepatic function panel - Lipid panel - TSH - PSA  4. Need for hepatitis C screening test  - Hep C Antibody  5. Encounter for screening for HIV  - HIV Antibody (routine testing w rflx)  6. Decreased sex drive  - Testosterone Total,Free,Bio, Males  7. Need for prophylactic vaccination and inoculation  against influenza  - Flu Vaccine QUAD 6+ mos PF IM (Fluarix Quad PF)  [name], NP

## 2018-06-22 NOTE — Patient Instructions (Signed)
It was great seeing you today !  We will follow up with you regarding your blood work   I hope you have a great holiday season

## 2018-06-23 ENCOUNTER — Telehealth: Payer: Self-pay | Admitting: Adult Health

## 2018-06-23 NOTE — Telephone Encounter (Signed)
Noted  

## 2018-06-23 NOTE — Telephone Encounter (Signed)
Copied from [place] [id number]. Topic: Quick Communication - See Telephone Encounter >> Jun 23, 2018 12:52 PM Rutherford Nail, NT wrote: CRM for notification. See Telephone encounter for: 06/23/18. Patient calling to check status of lab results from 06/22/18. Please advise. Would like a call back tomorrow morning.

## 2018-06-25 ENCOUNTER — Telehealth: Payer: Self-pay | Admitting: Adult Health

## 2018-06-25 NOTE — Telephone Encounter (Signed)
Charted in result notes. 

## 2018-06-25 NOTE — Telephone Encounter (Signed)
Copied from [place] [id number]. Topic: Quick Communication - Lab Results (Clinic Use ONLY) >> Jun 24, 2018  5:08 PM Miles Costain T, CMA wrote: Called patient to inform them of all lab results from 06/22/18. When patient returns call, triage nurse may disclose results. >> Jun 25, 2018 11:00 AM Alfredia Ferguson R wrote: Patient returned call for results

## 2018-06-27 LAB — HEPATITIS C ANTIBODY
HEP C AB: NONREACTIVE
SIGNAL TO CUT-OFF: 0.01 (ref <1.00)

## 2018-06-27 LAB — HIV ANTIBODY (ROUTINE TESTING W REFLEX): HIV 1&2 Ab, 4th Generation: NONREACTIVE

## 2018-06-27 LAB — TESTOSTERONE TOTAL,FREE,BIO, MALES
ALBUMIN MSPROF: 4.5 g/dL (ref 3.6–5.1)
Sex Hormone Binding: 52 nmol/L (ref 22–77)
TESTOSTERONE BIOAVAILABLE: 137.3 ng/dL (ref >=110.0)
Testosterone, Free: 66.8 pg/mL (ref 46.0–224.0)
Testosterone: 701 ng/dL (ref 250–827)

## 2018-07-22 ENCOUNTER — Telehealth: Payer: Self-pay | Admitting: Adult Health

## 2018-07-22 NOTE — Telephone Encounter (Signed)
Pt given lab results per notes of [name] NP  on 06/23/18. Pt verbalized understanding. Pt asked to discuss testosterone level. Appt made.

## 2018-07-23 MED FILL — SF 1.1% GEL: 1.1 | 30 days supply | Qty: 112 | Fill #0

## 2018-07-30 ENCOUNTER — Encounter: Payer: Self-pay | Admitting: Adult Health

## 2018-07-30 ENCOUNTER — Ambulatory Visit (INDEPENDENT_AMBULATORY_CARE_PROVIDER_SITE_OTHER): Payer: BLUE CROSS/BLUE SHIELD | Admitting: Adult Health

## 2018-07-30 VITALS — BP 124/72 | HR 60 | Temp 98.2°F | Ht 67.5 in | Wt 203.1 lb

## 2018-07-30 DIAGNOSIS — N529 Male erectile dysfunction, unspecified: Secondary | ICD-10-CM | POA: Diagnosis not present

## 2018-07-30 DIAGNOSIS — I10 Essential (primary) hypertension: Secondary | ICD-10-CM

## 2018-07-30 DIAGNOSIS — R6882 Decreased libido: Secondary | ICD-10-CM

## 2018-07-30 MED ORDER — LISINOPRIL 20 MG PO TABS: 20.0000 mg | ORAL_TABLET | Freq: Every day | ORAL | 1 refills | Status: DC

## 2018-07-30 NOTE — Patient Instructions (Signed)
Stop taking Atenolol   I have sent in lisinopril 20 mg to take with the lisinopril/hctz tab   Follow up with me in 2 weeks

## 2018-07-30 NOTE — Progress Notes (Signed)
Subjective:    Patient ID: Matthew Heads., male    DOB: [birth year], 63 y.o.   MRN: [id number]  HPI  63 year old male who  has a past medical history of Cancer ([place]), GERD (gastroesophageal reflux disease), History of radiation therapy (12/15/17- 01/29/18), and Hypertension.  He presents to the office today for follow up regarding decreased sex drive and ED. Back in December 2019 he had labs checked including Testosterone levels done during his CPE. His labs came back normal. He feels as though since having radiation done earlier this year his ED has become worse. He has trouble maintaining an erection.     Review of Systems See HPI   Past Medical History:  Diagnosis Date  . Cancer (HCC)    Parotid cancer-Left  . GERD (gastroesophageal reflux disease)   . History of radiation therapy 12/15/17- 01/29/18   Left Parotid and Left Neck 66 Gy total for 33 fractions with lower doses to at risk surrounding tissues.   . Hypertension     Social History   Socioeconomic History  . Marital status: Single    Spouse name: Not on file  . Number of children: Not on file  . Years of education: Not on file  . Highest education level: Not on file  Occupational History  . Not on file  Social Needs  . Financial resource strain: Not on file  . Food insecurity:    Worry: Not on file    Inability: Not on file  . Transportation needs:    Medical: No    Non-medical: No  Tobacco Use  . Smoking status: Never Smoker  . Smokeless tobacco: Never Used  Substance and Sexual Activity  . Alcohol use: Not Currently  . Drug use: No  . Sexual activity: Not Currently  Lifestyle  . Physical activity:    Days per week: Not on file    Minutes per session: Not on file  . Stress: Not on file  Relationships  . Social connections:    Talks on phone: Not on file    Gets together: Not on file    Attends religious service: Not on file    Active member of club or organization: Not on file    Attends  meetings of clubs or organizations: Not on file    Relationship status: Not on file  . Intimate partner violence:    Fear of current or ex partner: No    Emotionally abused: No    Physically abused: No    Forced sexual activity: No  Other Topics Concern  . Not on file  Social History Narrative  . Not on file    Past Surgical History:  Procedure Laterality Date  . APPENDECTOMY     2nd grade  . LEG SURGERY Left    surgery for a broken leg when he was 63 years old   . MULTIPLE EXTRACTIONS WITH ALVEOLOPLASTY N/A 11/20/2017   Procedure: Extaction of tooth #'s 14 and 15 with alveoloplasty and gross debridement of remaining teeth;  Surgeon: Lenn Cal, DDS;  Location: [facility];  Service: Oral Surgery;  Laterality: N/A;  . PAROTIDECTOMY Left 11/06/2017   Procedure: PAROTIDECTOMY;  Surgeon: [name], MD;  Location: [facility] OR;  Service: ENT;  Laterality: Left;  Possible skin graft, possible nerve graft Needs RNFA  . RADICAL NECK DISSECTION Left 11/06/2017   Procedure: RADICAL NECK DISSECTION;  Surgeon: [name], MD;  Location: [facility];  Service: ENT;  Laterality:  Left;  . SKIN SPLIT GRAFT Left 11/06/2017   Procedure: SKIN GRAFT SPLIT THICKNESS;  Surgeon: [name], MD;  Location: [facility] OR;  Service: ENT;  Laterality: Left;    Family History  Problem Relation Age of Onset  . Arthritis Mother   . Hypertension Mother   . Diabetes Father   . Diabetes Brother     Allergies  Allergen Reactions  . Bee Pollen     Caused sinus infection  . Pollen Extract     Caused sinus infection    Current Outpatient Medications on File Prior to Visit  Medication Sig Dispense Refill  . acetaminophen (TYLENOL) 325 MG tablet Take 650 mg by mouth every 6 (six) hours as needed.    Marland Kitchen atenolol (TENORMIN) 50 MG tablet Take 1 tablet (50 mg total) by mouth daily. 90 tablet 3  . calcium carbonate (OS-CAL - DOSED IN MG OF ELEMENTAL CALCIUM) 1250 (500 Ca) MG tablet Take 1 tablet by mouth daily.    .  cholecalciferol (VITAMIN D) 1000 units tablet Take 1,000 Units by mouth daily.    Marland Kitchen HYDROcodone-acetaminophen (NORCO) 7.5-325 MG tablet Take 1 tablet by mouth every 6 (six) hours as needed for moderate pain. 30 tablet 0  . lisinopril-hydrochlorothiazide (PRINZIDE,ZESTORETIC) 20-25 MG tablet Take 1 tablet by mouth daily. 90 tablet 3  . Multiple Vitamins-Minerals (MULTIVITAMIN WITH MINERALS) tablet Take 1 tablet by mouth daily.    . pantoprazole (PROTONIX) 40 MG tablet Take 1 tablet (40 mg total) by mouth daily. (Patient taking differently: Take 40 mg by mouth at bedtime. ) 90 tablet 3  . sodium fluoride (FLUORISHIELD) 1.1 % GEL dental gel Instill one drop of gel per tooth space of fluoride tray. Place over teeth for 5 minutes. Remove. Spit out excess. Repeat nightly. 120 mL prn  . vitamin E 400 UNIT capsule Take 400 Units by mouth daily.     No current facility-administered medications on file prior to visit.     BP 124/72 (BP Location: Left Arm, Patient Position: Sitting, Cuff Size: Normal)   Pulse 60   Temp 98.2 F (36.8 C) (Oral)   Ht 5' 7.5" (1.715 m)   Wt 203 lb 2 oz (92.1 kg)   SpO2 98%   BMI 31.34 kg/m       Objective:   Physical Exam Vitals signs and nursing note reviewed.  Constitutional:      Appearance: Normal appearance.  Cardiovascular:     Rate and Rhythm: Normal rate and regular rhythm.     Pulses: Normal pulses.     Heart sounds: Normal heart sounds.  Pulmonary:     Effort: Pulmonary effort is normal.     Breath sounds: Normal breath sounds.  Musculoskeletal: Normal range of motion.  Skin:    General: Skin is warm.     Capillary Refill: Capillary refill takes less than 2 seconds.  Neurological:     General: No focal deficit present.     Mental Status: He is alert and oriented to person, place, and time.       Assessment & Plan:  Will look at medication reaction. Will have him d/c Atenolol for 2 weeks and add an additional dose of lisinopril. He does not  want to try Viagra at this time. Consider referral to urology   [name], NP

## 2018-08-20 ENCOUNTER — Ambulatory Visit: Payer: Self-pay | Admitting: Adult Health

## 2018-08-27 ENCOUNTER — Encounter: Payer: Self-pay | Admitting: Adult Health

## 2018-08-27 ENCOUNTER — Ambulatory Visit (INDEPENDENT_AMBULATORY_CARE_PROVIDER_SITE_OTHER): Payer: BLUE CROSS/BLUE SHIELD | Admitting: Adult Health

## 2018-08-27 VITALS — BP 132/92 | Temp 98.2°F | Wt 203.0 lb

## 2018-08-27 DIAGNOSIS — K2100 Gastro-esophageal reflux disease with esophagitis, without bleeding: Secondary | ICD-10-CM

## 2018-08-27 DIAGNOSIS — J0140 Acute pansinusitis, unspecified: Secondary | ICD-10-CM

## 2018-08-27 DIAGNOSIS — I10 Essential (primary) hypertension: Secondary | ICD-10-CM

## 2018-08-27 DIAGNOSIS — N529 Male erectile dysfunction, unspecified: Secondary | ICD-10-CM

## 2018-08-27 MED ORDER — LISINOPRIL 20 MG PO TABS: 20.0000 mg | ORAL_TABLET | Freq: Every day | ORAL | 3 refills | Status: DC

## 2018-08-27 MED ORDER — DOXYCYCLINE HYCLATE 100 MG PO CAPS: 100.0000 mg | ORAL_CAPSULE | Freq: Two times a day (BID) | ORAL | 0 refills | Status: DC

## 2018-08-27 MED ORDER — PANTOPRAZOLE SODIUM 40 MG PO TBEC: 40.0000 mg | DELAYED_RELEASE_TABLET | Freq: Every day | ORAL | 3 refills | Status: DC

## 2018-08-27 MED ORDER — LISINOPRIL-HYDROCHLOROTHIAZIDE 20-25 MG PO TABS: 1.0000 | ORAL_TABLET | Freq: Every day | ORAL | 3 refills | Status: DC

## 2018-08-27 NOTE — Progress Notes (Signed)
Subjective:    Patient ID: Matthew Heads., male    DOB: [birth year], 63 y.o.   MRN: [id number]  HPI 63 year old male who  has a past medical history of Cancer ([place]), GERD (gastroesophageal reflux disease), History of radiation therapy (12/15/17- 01/29/18), and Hypertension. He presents to the office today for 2-week follow-up regarding hypertension and erectile dysfunction.  During the last visit it was thought that prescription for atenolol may be contributing to his erectile dysfunction.  This medication was decreased and 20 mg of lisinopril was added to his regimen.  He reports that since he stopped Atenolol he feels as though he has more energy and has noticed improvement in ED and libido.   He has been monitoring his BP at home and and reports that at home his BP has been in the 120's/80's.   BP Readings from Last 3 Encounters:  08/27/18 (!) 132/92  07/30/18 124/72  06/22/18 124/76   He also complains of sinuitis like symptoms for the last 14 days. His symptoms include sinus pain/pressure, productive cough, fevers headaches and nasal congestion. He denies fevers, chills, n/v/d. He has been using Nyquil and other sinus cold medications    Review of Systems See HPI   Past Medical History:  Diagnosis Date  . Cancer (HCC)    Parotid cancer-Left  . GERD (gastroesophageal reflux disease)   . History of radiation therapy 12/15/17- 01/29/18   Left Parotid and Left Neck 66 Gy total for 33 fractions with lower doses to at risk surrounding tissues.   . Hypertension     Social History   Socioeconomic History  . Marital status: Single    Spouse name: Not on file  . Number of children: Not on file  . Years of education: Not on file  . Highest education level: Not on file  Occupational History  . Not on file  Social Needs  . Financial resource strain: Not on file  . Food insecurity:    Worry: Not on file    Inability: Not on file  . Transportation needs:    Medical: No      Non-medical: No  Tobacco Use  . Smoking status: Never Smoker  . Smokeless tobacco: Never Used  Substance and Sexual Activity  . Alcohol use: Not Currently  . Drug use: No  . Sexual activity: Not Currently  Lifestyle  . Physical activity:    Days per week: Not on file    Minutes per session: Not on file  . Stress: Not on file  Relationships  . Social connections:    Talks on phone: Not on file    Gets together: Not on file    Attends religious service: Not on file    Active member of club or organization: Not on file    Attends meetings of clubs or organizations: Not on file    Relationship status: Not on file  . Intimate partner violence:    Fear of current or ex partner: No    Emotionally abused: No    Physically abused: No    Forced sexual activity: No  Other Topics Concern  . Not on file  Social History Narrative  . Not on file    Past Surgical History:  Procedure Laterality Date  . APPENDECTOMY     2nd grade  . LEG SURGERY Left    surgery for a broken leg when he was 63 years old   . MULTIPLE EXTRACTIONS WITH ALVEOLOPLASTY N/A 11/20/2017  Procedure: Extaction of tooth #'s 14 and 15 with alveoloplasty and gross debridement of remaining teeth;  Surgeon: Lenn Cal, DDS;  Location: [facility];  Service: Oral Surgery;  Laterality: N/A;  . PAROTIDECTOMY Left 11/06/2017   Procedure: PAROTIDECTOMY;  Surgeon: [name], MD;  Location: [facility] OR;  Service: ENT;  Laterality: Left;  Possible skin graft, possible nerve graft Needs RNFA  . RADICAL NECK DISSECTION Left 11/06/2017   Procedure: RADICAL NECK DISSECTION;  Surgeon: [name], MD;  Location: [place];  Service: ENT;  Laterality: Left;  . SKIN SPLIT GRAFT Left 11/06/2017   Procedure: SKIN GRAFT SPLIT THICKNESS;  Surgeon: [name], MD;  Location: [facility] OR;  Service: ENT;  Laterality: Left;    Family History  Problem Relation Age of Onset  . Arthritis Mother   . Hypertension Mother   . Diabetes Father   .  Diabetes Brother     Allergies  Allergen Reactions  . Bee Pollen     Caused sinus infection  . Pollen Extract     Caused sinus infection    Current Outpatient Medications on File Prior to Visit  Medication Sig Dispense Refill  . acetaminophen (TYLENOL) 325 MG tablet Take 650 mg by mouth every 6 (six) hours as needed.    . calcium carbonate (OS-CAL - DOSED IN MG OF ELEMENTAL CALCIUM) 1250 (500 Ca) MG tablet Take 1 tablet by mouth daily.    . cholecalciferol (VITAMIN D) 1000 units tablet Take 1,000 Units by mouth daily.    Marland Kitchen HYDROcodone-acetaminophen (NORCO) 7.5-325 MG tablet Take 1 tablet by mouth every 6 (six) hours as needed for moderate pain. 30 tablet 0  . lisinopril (PRINIVIL,ZESTRIL) 20 MG tablet Take 1 tablet (20 mg total) by mouth daily. 30 tablet 1  . lisinopril-hydrochlorothiazide (PRINZIDE,ZESTORETIC) 20-25 MG tablet Take 1 tablet by mouth daily. 90 tablet 3  . Multiple Vitamins-Minerals (MULTIVITAMIN WITH MINERALS) tablet Take 1 tablet by mouth daily.    . pantoprazole (PROTONIX) 40 MG tablet Take 1 tablet (40 mg total) by mouth daily. (Patient taking differently: Take 40 mg by mouth at bedtime. ) 90 tablet 3  . sodium fluoride (FLUORISHIELD) 1.1 % GEL dental gel Instill one drop of gel per tooth space of fluoride tray. Place over teeth for 5 minutes. Remove. Spit out excess. Repeat nightly. 120 mL prn  . vitamin E 400 UNIT capsule Take 400 Units by mouth daily.     No current facility-administered medications on file prior to visit.     BP (!) 132/92   Temp 98.2 F (36.8 C)   Wt 203 lb (92.1 kg)   BMI 31.33 kg/m       Objective:   Physical Exam Vitals signs reviewed.  Constitutional:      Appearance: Normal appearance. He is ill-appearing.  HENT:     Head: Normocephalic and atraumatic.     Right Ear: Tympanic membrane, ear canal and external ear normal. There is no impacted cerumen.     Left Ear: Tympanic membrane, ear canal and external ear normal.     Nose:  Congestion and rhinorrhea present.     Right Sinus: Maxillary sinus tenderness and frontal sinus tenderness present.     Left Sinus: Maxillary sinus tenderness and frontal sinus tenderness present.     Mouth/Throat:     Mouth: Mucous membranes are moist.     Pharynx: Oropharynx is clear.  Eyes:     Extraocular Movements: Extraocular movements intact.     Pupils:  Pupils are equal, round, and reactive to light.  Cardiovascular:     Rate and Rhythm: Normal rate and regular rhythm.     Pulses: Normal pulses.     Heart sounds: Normal heart sounds.  Pulmonary:     Effort: Pulmonary effort is normal.     Breath sounds: Normal breath sounds.  Skin:    General: Skin is warm and dry.  Neurological:     General: No focal deficit present.     Mental Status: He is alert and oriented to person, place, and time.  Psychiatric:        Mood and Affect: Mood normal.        Behavior: Behavior normal.        Thought Content: Thought content normal.        Judgment: Judgment normal.       Assessment & Plan:  .1. Essential hypertension  - lisinopril (PRINIVIL,ZESTRIL) 20 MG tablet; Take 1 tablet (20 mg total) by mouth daily.  Dispense: 90 tablet; Refill: 3 - lisinopril-hydrochlorothiazide (PRINZIDE,ZESTORETIC) 20-25 MG tablet; Take 1 tablet by mouth daily.  Dispense: 90 tablet; Refill: 3  2. Acute non-recurrent pansinusitis  - doxycycline (VIBRAMYCIN) 100 MG capsule; Take 1 capsule (100 mg total) by mouth 2 (two) times daily.  Dispense: 14 capsule; Refill: 0  3. Gastroesophageal reflux disease with esophagitis  - pantoprazole (PROTONIX) 40 MG tablet; Take 1 tablet (40 mg total) by mouth at bedtime.  Dispense: 90 tablet; Refill: 3  4. Erectile dysfunction, unspecified erectile dysfunction type - Improved since stopping Atenolol   [name]

## 2018-10-09 ENCOUNTER — Ambulatory Visit: Payer: Self-pay | Admitting: Radiation Oncology

## 2018-11-06 ENCOUNTER — Ambulatory Visit: Payer: BLUE CROSS/BLUE SHIELD

## 2018-11-06 ENCOUNTER — Ambulatory Visit: Payer: BLUE CROSS/BLUE SHIELD | Admitting: Radiation Oncology

## 2018-11-26 MED FILL — SF 1.1% GEL: 1.1 | 30 days supply | Qty: 112 | Fill #1

## 2019-04-27 ENCOUNTER — Telehealth: Payer: Self-pay

## 2019-04-27 NOTE — Telephone Encounter (Signed)
Copied from [place] [id number]. Topic: Referral - Request for Referral >> Apr 27, 2019 10:29 AM Rutherford Nail, NT wrote: Has patient seen PCP for this complaint? No. *If NO, is insurance requiring patient see PCP for this issue before PCP can refer them? Referral for which specialty: Orthopedic specialist Preferred provider/office:  Reason for referral: Sciatic nerve pain

## 2019-04-27 NOTE — Telephone Encounter (Signed)
We likely can take care of this in the office. He will need a visit prior to referral anyways

## 2019-04-27 NOTE — Telephone Encounter (Signed)
Left a detailed message on verified voice mail.  CRM created.  

## 2019-04-27 NOTE — Telephone Encounter (Signed)
Ok for referral?

## 2019-04-28 NOTE — Telephone Encounter (Signed)
Left a message for a return call.

## 2019-04-29 ENCOUNTER — Ambulatory Visit (INDEPENDENT_AMBULATORY_CARE_PROVIDER_SITE_OTHER): Payer: BC Managed Care – PPO | Admitting: Adult Health

## 2019-04-29 ENCOUNTER — Encounter: Payer: Self-pay | Admitting: Adult Health

## 2019-04-29 ENCOUNTER — Other Ambulatory Visit: Payer: Self-pay

## 2019-04-29 VITALS — BP 130/70 | Temp 97.9°F | Wt 205.0 lb

## 2019-04-29 DIAGNOSIS — M5442 Lumbago with sciatica, left side: Secondary | ICD-10-CM | POA: Diagnosis not present

## 2019-04-29 MED ORDER — KETOROLAC TROMETHAMINE 60 MG/2ML IM SOLN
60.0000 mg | Freq: Once | INTRAMUSCULAR | Status: AC
  Administered 2019-04-29: 60 mg via INTRAMUSCULAR

## 2019-04-29 MED ORDER — METHYLPREDNISOLONE 4 MG PO TBPK: ORAL_TABLET | ORAL | 0 refills | Status: DC

## 2019-04-29 MED ORDER — METHYLPREDNISOLONE ACETATE 80 MG/ML IJ SUSP
80.0000 mg | Freq: Once | INTRAMUSCULAR | Status: AC
  Administered 2019-04-29: 80 mg via INTRAMUSCULAR

## 2019-04-29 NOTE — Patient Instructions (Signed)
It was great seeing you today   You received a Toradol injection and steroid injection into the office.   I have sent in a steroid pack for you to start tomorrow   Do gentle stretching exercises   Follow up if no improvement

## 2019-04-29 NOTE — Progress Notes (Signed)
Subjective:    Patient ID: Matthew Heads., male    DOB: 06/20/1956, 63 y.o.   MRN: [id number]  HPI 63 year old male who  has a past medical history of Cancer ([place]), GERD (gastroesophageal reflux disease), History of radiation therapy (12/15/17- 01/29/18), and Hypertension.  He presents to the office today for an acute issue of left sided sciatica pain. He has had this pain in the past and reports that it feels similar. Pain started last week and is worse when sitting for long periods of time and with change of position. Pain is felt as a " ache" and " sharp shooting pain down my left leg"  Pain is worse with standing and sitting for long periods of time.   He denies falls or injury.   He has been using motrin and tylenol without relief   Review of Systems See HPI   Past Medical History:  Diagnosis Date  . Cancer (HCC)    Parotid cancer-Left  . GERD (gastroesophageal reflux disease)   . History of radiation therapy 12/15/17- 01/29/18   Left Parotid and Left Neck 66 Gy total for 33 fractions with lower doses to at risk surrounding tissues.   . Hypertension     Social History   Socioeconomic History  . Marital status: Single    Spouse name: Not on file  . Number of children: Not on file  . Years of education: Not on file  . Highest education level: Not on file  Occupational History  . Not on file  Social Needs  . Financial resource strain: Not on file  . Food insecurity    Worry: Not on file    Inability: Not on file  . Transportation needs    Medical: No    Non-medical: No  Tobacco Use  . Smoking status: Never Smoker  . Smokeless tobacco: Never Used  Substance and Sexual Activity  . Alcohol use: Not Currently  . Drug use: No  . Sexual activity: Not Currently  Lifestyle  . Physical activity    Days per week: Not on file    Minutes per session: Not on file  . Stress: Not on file  Relationships  . Social Herbalist on phone: Not on file   Gets together: Not on file    Attends religious service: Not on file    Active member of club or organization: Not on file    Attends meetings of clubs or organizations: Not on file    Relationship status: Not on file  . Intimate partner violence    Fear of current or ex partner: No    Emotionally abused: No    Physically abused: No    Forced sexual activity: No  Other Topics Concern  . Not on file  Social History Narrative  . Not on file    Past Surgical History:  Procedure Laterality Date  . APPENDECTOMY     2nd grade  . LEG SURGERY Left    surgery for a broken leg when he was 63 years old   . MULTIPLE EXTRACTIONS WITH ALVEOLOPLASTY N/A 11/20/2017   Procedure: Extaction of tooth #'s 14 and 15 with alveoloplasty and gross debridement of remaining teeth;  Surgeon: Lenn Cal, DDS;  Location: [facility];  Service: Oral Surgery;  Laterality: N/A;  . PAROTIDECTOMY Left 11/06/2017   Procedure: PAROTIDECTOMY;  Surgeon: [name], MD;  Location: [place];  Service: ENT;  Laterality: Left;  Possible skin  graft, possible nerve graft Needs RNFA  . RADICAL NECK DISSECTION Left 11/06/2017   Procedure: RADICAL NECK DISSECTION;  Surgeon: [name], MD;  Location: [place];  Service: ENT;  Laterality: Left;  . SKIN SPLIT GRAFT Left 11/06/2017   Procedure: SKIN GRAFT SPLIT THICKNESS;  Surgeon: [name], MD;  Location[facility];  Service: ENT;  Laterality: Left;    Family History  Problem Relation Age of Onset  . Arthritis Mother   . Hypertension Mother   . Diabetes Father   . Diabetes Brother     Allergies  Allergen Reactions  . Bee Pollen     Caused sinus infection  . Pollen Extract     Caused sinus infection    Current Outpatient Medications on File Prior to Visit  Medication Sig Dispense Refill  . acetaminophen (TYLENOL) 325 MG tablet Take 650 mg by mouth every 6 (six) hours as needed.    . calcium carbonate (OS-CAL - DOSED IN MG OF ELEMENTAL CALCIUM) 1250 (500 Ca) MG tablet  Take 1 tablet by mouth daily.    . cholecalciferol (VITAMIN D) 1000 units tablet Take 1,000 Units by mouth daily.    Marland Kitchen lisinopril (PRINIVIL,ZESTRIL) 20 MG tablet Take 1 tablet (20 mg total) by mouth daily. 90 tablet 3  . lisinopril-hydrochlorothiazide (PRINZIDE,ZESTORETIC) 20-25 MG tablet Take 1 tablet by mouth daily. 90 tablet 3  . Multiple Vitamins-Minerals (MULTIVITAMIN WITH MINERALS) tablet Take 1 tablet by mouth daily.    . pantoprazole (PROTONIX) 40 MG tablet Take 1 tablet (40 mg total) by mouth at bedtime. 90 tablet 3  . sodium fluoride (FLUORISHIELD) 1.1 % GEL dental gel Instill one drop of gel per tooth space of fluoride tray. Place over teeth for 5 minutes. Remove. Spit out excess. Repeat nightly. 120 mL prn  . vitamin E 400 UNIT capsule Take 400 Units by mouth daily.     No current facility-administered medications on file prior to visit.     BP 130/70   Temp 97.9 F (36.6 C)   Wt 205 lb (93 kg)   BMI 31.63 kg/m       Objective:   Physical Exam Vitals signs and nursing note reviewed.  Constitutional:      Appearance: Normal appearance.  Cardiovascular:     Rate and Rhythm: Normal rate and regular rhythm.     Pulses: Normal pulses.     Heart sounds: Normal heart sounds.  Pulmonary:     Effort: Pulmonary effort is normal.     Breath sounds: Normal breath sounds.  Musculoskeletal:        General: Tenderness (left lateral leg to knee) present.  Skin:    General: Skin is warm and dry.  Neurological:     Mental Status: He is alert.     Comments: Limping gait    Psychiatric:        Mood and Affect: Mood normal.        Behavior: Behavior normal.        Thought Content: Thought content normal.        Judgment: Judgment normal.       Assessment & Plan:  1. Acute bilateral low back pain with left-sided sciatica - methylPREDNISolone acetate (DEPO-MEDROL) injection 80 mg - methylPREDNISolone (MEDROL DOSEPAK) 4 MG TBPK tablet; Take as directed  Dispense: 21 tablet;  Refill: 0 - ketorolac (TORADOL) injection 60 mg - Gentle stretching exercises - Warm compress - Follow up if not improvement in the next 2-3 days  [name], NP

## 2019-06-30 ENCOUNTER — Other Ambulatory Visit: Payer: Self-pay

## 2019-07-01 ENCOUNTER — Encounter: Payer: Self-pay | Admitting: Adult Health

## 2019-07-01 ENCOUNTER — Ambulatory Visit (INDEPENDENT_AMBULATORY_CARE_PROVIDER_SITE_OTHER): Payer: BC Managed Care – PPO | Admitting: Adult Health

## 2019-07-01 VITALS — BP 140/88 | Temp 97.6°F | Wt 208.0 lb

## 2019-07-01 DIAGNOSIS — Z0000 Encounter for general adult medical examination without abnormal findings: Secondary | ICD-10-CM | POA: Diagnosis not present

## 2019-07-01 DIAGNOSIS — Z125 Encounter for screening for malignant neoplasm of prostate: Secondary | ICD-10-CM

## 2019-07-01 DIAGNOSIS — I10 Essential (primary) hypertension: Secondary | ICD-10-CM | POA: Diagnosis not present

## 2019-07-01 DIAGNOSIS — K2100 Gastro-esophageal reflux disease with esophagitis, without bleeding: Secondary | ICD-10-CM | POA: Diagnosis not present

## 2019-07-01 DIAGNOSIS — Z1211 Encounter for screening for malignant neoplasm of colon: Secondary | ICD-10-CM

## 2019-07-01 MED ORDER — LISINOPRIL 40 MG PO TABS: 40.0000 mg | ORAL_TABLET | Freq: Every day | ORAL | 3 refills | Status: DC

## 2019-07-01 MED ORDER — PANTOPRAZOLE SODIUM 40 MG PO TBEC: 40.0000 mg | DELAYED_RELEASE_TABLET | Freq: Every day | ORAL | 3 refills | Status: DC

## 2019-07-01 MED ORDER — HYDROCHLOROTHIAZIDE 25 MG PO TABS: 25.0000 mg | ORAL_TABLET | Freq: Every day | ORAL | 3 refills | Status: DC

## 2019-07-01 NOTE — Patient Instructions (Addendum)
It was great seeing you today   Please schedule your lab visit for next week   I have sent in your medications   Please work on diet and exercise to lose weight.   Someone will call you to schedule your colonoscopy.    Health Maintenance, Male A healthy lifestyle and preventative care can promote health and wellness.  Maintain regular health, dental, and eye exams.  Eat a healthy diet. Foods like vegetables, fruits, whole grains, low-fat dairy products, and lean protein foods contain the nutrients you need and are low in calories. Decrease your intake of foods high in solid fats, added sugars, and salt. Get information about a proper diet from your health care provider, if necessary.  Regular physical exercise is one of the most important things you can do for your health. Most adults should get at least 150 minutes of moderate-intensity exercise (any activity that increases your heart rate and causes you to sweat) each week. In addition, most adults need muscle-strengthening exercises on 2 or more days a week.   Maintain a healthy weight. The body mass index (BMI) is a screening tool to identify possible weight problems. It provides an estimate of body fat based on height and weight. Your health care provider can find your BMI and can help you achieve or maintain a healthy weight. For males 20 years and older:  A BMI below 18.5 is considered underweight.  A BMI of 18.5 to 24.9 is normal.  A BMI of 25 to 29.9 is considered overweight.  A BMI of 30 and above is considered obese.  Maintain normal blood lipids and cholesterol by exercising and minimizing your intake of saturated fat. Eat a balanced diet with plenty of fruits and vegetables. Blood tests for lipids and cholesterol should begin at age 76 and be repeated every 5 years. If your lipid or cholesterol levels are high, you are over age 69, or you are at high risk for heart disease, you may need your cholesterol levels checked more  frequently.Ongoing high lipid and cholesterol levels should be treated with medicines if diet and exercise are not working.  If you smoke, find out from your health care provider how to quit. If you do not use tobacco, do not start.  Lung cancer screening is recommended for adults aged 28-80 years who are at high risk for developing lung cancer because of a history of smoking. A yearly low-dose CT scan of the lungs is recommended for people who have at least a 30-pack-year history of smoking and are current smokers or have quit within the past 15 years. A pack year of smoking is smoking an average of 1 pack of cigarettes a day for 1 year (for example, a 30-pack-year history of smoking could mean smoking 1 pack a day for 30 years or 2 packs a day for 15 years). Yearly screening should continue until the smoker has stopped smoking for at least 15 years. Yearly screening should be stopped for people who develop a health problem that would prevent them from having lung cancer treatment.  If you choose to drink alcohol, do not have more than 2 drinks per day. One drink is considered to be 12 oz (360 mL) of beer, 5 oz (150 mL) of wine, or 1.5 oz (45 mL) of liquor.  Avoid the use of street drugs. Do not share needles with anyone. Ask for help if you need support or instructions about stopping the use of drugs.  High blood pressure  causes heart disease and increases the risk of stroke. High blood pressure is more likely to develop in:  People who have blood pressure in the end of the normal range (100-139/85-89 mm Hg).  People who are overweight or obese.  People who are African American.  If you are 12-16 years of age, have your blood pressure checked every 3-5 years. If you are 33 years of age or older, have your blood pressure checked every year. You should have your blood pressure measured twice--once when you are at a hospital or clinic, and once when you are not at a hospital or clinic. Record the  average of the two measurements. To check your blood pressure when you are not at a hospital or clinic, you can use:  An automated blood pressure machine at a pharmacy.  A home blood pressure monitor.  If you are 32-52 years old, ask your health care provider if you should take aspirin to prevent heart disease.  Diabetes screening involves taking a blood sample to check your fasting blood sugar level. This should be done once every 3 years after age 10 if you are at a normal weight and without risk factors for diabetes. Testing should be considered at a younger age or be carried out more frequently if you are overweight and have at least 1 risk factor for diabetes.  Colorectal cancer can be detected and often prevented. Most routine colorectal cancer screening begins at the age of 24 and continues through age 1. However, your health care provider may recommend screening at an earlier age if you have risk factors for colon cancer. On a yearly basis, your health care provider may provide home test kits to check for hidden blood in the stool. A small camera at the end of a tube may be used to directly examine the colon (sigmoidoscopy or colonoscopy) to detect the earliest forms of colorectal cancer. Talk to your health care provider about this at age 32 when routine screening begins. A direct exam of the colon should be repeated every 5-10 years through age 33, unless early forms of precancerous polyps or small growths are found.  People who are at an increased risk for hepatitis B should be screened for this virus. You are considered at high risk for hepatitis B if:  You were born in a country where hepatitis B occurs often. Talk with your health care provider about which countries are considered high risk.  Your parents were born in a high-risk country and you have not received a shot to protect against hepatitis B (hepatitis B vaccine).  You have HIV or AIDS.  You use needles to inject street  drugs.  You live with, or have sex with, someone who has hepatitis B.  You are a man who has sex with other men (MSM).  You get hemodialysis treatment.  You take certain medicines for conditions like cancer, organ transplantation, and autoimmune conditions.  Hepatitis C blood testing is recommended for all people born from [birth year] through 1964 and any individual with known risk factors for hepatitis C.  Healthy men should no longer receive prostate-specific antigen (PSA) blood tests as part of routine cancer screening. Talk to your health care provider about prostate cancer screening.  Testicular cancer screening is not recommended for adolescents or adult males who have no symptoms. Screening includes self-exam, a health care provider exam, and other screening tests. Consult with your health care provider about any symptoms you have or any concerns you have  about testicular cancer.  Practice safe sex. Use condoms and avoid high-risk sexual practices to reduce the spread of sexually transmitted infections (STIs).  You should be screened for STIs, including gonorrhea and chlamydia if:  You are sexually active and are younger than 24 years.  You are older than 24 years, and your health care provider tells you that you are at risk for this type of infection.  Your sexual activity has changed since you were last screened, and you are at an increased risk for chlamydia or gonorrhea. Ask your health care provider if you are at risk.  If you are at risk of being infected with HIV, it is recommended that you take a prescription medicine daily to prevent HIV infection. This is called pre-exposure prophylaxis (PrEP). You are considered at risk if:  You are a man who has sex with other men (MSM).  You are a heterosexual man who is sexually active with multiple partners.  You take drugs by injection.  You are sexually active with a partner who has HIV.  Talk with your health care provider about  whether you are at high risk of being infected with HIV. If you choose to begin PrEP, you should first be tested for HIV. You should then be tested every 3 months for as long as you are taking PrEP.  Use sunscreen. Apply sunscreen liberally and repeatedly throughout the day. You should seek shade when your shadow is shorter than you. Protect yourself by wearing long sleeves, pants, a wide-brimmed hat, and sunglasses year round whenever you are outdoors.  Tell your health care provider of new moles or changes in moles, especially if there is a change in shape or color. Also, tell your health care provider if a mole is larger than the size of a pencil eraser.  A one-time screening for abdominal aortic aneurysm (AAA) and surgical repair of large AAAs by ultrasound is recommended for men aged 46-75 years who are current or former smokers.  Stay current with your vaccines (immunizations).   This information is not intended to replace advice given to you by your health care provider. Make sure you discuss any questions you have with your health care provider.   Document Released: 12/21/2007 Document Revised: 07/15/2014 Document Reviewed: 11/19/2010 Elsevier Interactive Patient Education [employer].

## 2019-07-01 NOTE — Progress Notes (Signed)
Subjective:    Patient ID: Matthew Meyer., male    DOB: [birth year], 63 y.o.   MRN: :[id number]  HPI Patient presents for yearly preventative medicine examination. He is a pleasant 63 year old male who  has a past medical history of Cancer ([place]), GERD (gastroesophageal reflux disease), History of radiation therapy (12/15/17- 01/29/18), and Hypertension.  Hypertension -currently prescribed lisinopril 240mg  and hctz 25 mg.   He reports no dizziness, lightheadedness, chest pain, or shortness of breath.  He has been monitoring his blood pressures at home periodically and reports readings in the 120s over 80s. He did not take his blood pressure medications this morning.  BP Readings from Last 3 Encounters:  07/01/19 140/88  04/29/19 130/70  08/27/18 (!) 132/92   GERD - well controlled with protonix  All immunizations and health maintenance protocols were reviewed with the patient and needed orders were placed.  Appropriate screening laboratory values were ordered for the patient including screening of hyperlipidemia, renal function and hepatic function. If indicated by BPH, a PSA was ordered.  Medication reconciliation,  past medical history, social history, problem list and allergies were reviewed in detail with the patient  Goals were established with regard to weight loss, exercise, and  diet in compliance with medications  Wt Readings from Last 3 Encounters:  07/01/19 208 lb (94.3 kg)  04/29/19 205 lb (93 kg)  08/27/18 203 lb (92.1 kg)   End of life planning was discussed.  He is due for colonoscopy    Review of Systems  Constitutional: Negative.   HENT: Negative.   Eyes: Negative.   Respiratory: Negative.   Cardiovascular: Negative.   Gastrointestinal: Negative.   Endocrine: Negative.   Genitourinary: Negative.   Musculoskeletal: Negative.   Skin: Negative.   Allergic/Immunologic: Negative.   Neurological: Negative.   Hematological: Negative.     Psychiatric/Behavioral: Negative.   All other systems reviewed and are negative.  Past Medical History:  Diagnosis Date  . Cancer (HCC)    Parotid cancer-Left  . GERD (gastroesophageal reflux disease)   . History of radiation therapy 12/15/17- 01/29/18   Left Parotid and Left Neck 66 Gy total for 33 fractions with lower doses to at risk surrounding tissues.   . Hypertension     Social History   Socioeconomic History  . Marital status: Single    Spouse name: Not on file  . Number of children: Not on file  . Years of education: Not on file  . Highest education level: Not on file  Occupational History  . Not on file  Tobacco Use  . Smoking status: Never Smoker  . Smokeless tobacco: Never Used  Substance and Sexual Activity  . Alcohol use: Not Currently  . Drug use: No  . Sexual activity: Not Currently  Other Topics Concern  . Not on file  Social History Narrative  . Not on file   Social Determinants of Health   Financial Resource Strain:   . Difficulty of Paying Living Expenses: Not on file  Food Insecurity:   . Worried About Charity fundraiser in the Last Year: Not on file  . Ran Out of Food in the Last Year: Not on file  Transportation Needs:   . Lack of Transportation (Medical): Not on file  . Lack of Transportation (Non-Medical): Not on file  Physical Activity:   . Days of Exercise per Week: Not on file  . Minutes of Exercise per Session: Not on file  Stress:   .  Feeling of Stress : Not on file  Social Connections:   . Frequency of Communication with Friends and Family: Not on file  . Frequency of Social Gatherings with Friends and Family: Not on file  . Attends Religious Services: Not on file  . Active Member of Clubs or Organizations: Not on file  . Attends Archivist Meetings: Not on file  . Marital Status: Not on file  Intimate Partner Violence:   . Fear of Current or Ex-Partner: Not on file  . Emotionally Abused: Not on file  . Physically  Abused: Not on file  . Sexually Abused: Not on file    Past Surgical History:  Procedure Laterality Date  . APPENDECTOMY     2nd grade  . LEG SURGERY Left    surgery for a broken leg when he was 63 years old   . MULTIPLE EXTRACTIONS WITH ALVEOLOPLASTY N/A 11/20/2017   Procedure: Extaction of tooth #'s 14 and 15 with alveoloplasty and gross debridement of remaining teeth;  Surgeon: Lenn Cal, DDS;  Location: [facility];  Service: Oral Surgery;  Laterality: N/A;  . PAROTIDECTOMY Left 11/06/2017   Procedure: PAROTIDECTOMY;  Surgeon: [name], MD;  Location: [facility] OR;  Service: ENT;  Laterality: Left;  Possible skin graft, possible nerve graft Needs RNFA  . RADICAL NECK DISSECTION Left 11/06/2017   Procedure: RADICAL NECK DISSECTION;  Surgeon: [name], MD;  Location: [facility];  Service: ENT;  Laterality: Left;  . SKIN SPLIT GRAFT Left 11/06/2017   Procedure: SKIN GRAFT SPLIT THICKNESS;  Surgeon: [name], MD;  Location: [facility];  Service: ENT;  Laterality: Left;    Family History  Problem Relation Age of Onset  . Arthritis Mother   . Hypertension Mother   . Diabetes Father   . Diabetes Brother     Allergies  Allergen Reactions  . Bee Pollen     Caused sinus infection  . Pollen Extract     Caused sinus infection    Current Outpatient Medications on File Prior to Visit  Medication Sig Dispense Refill  . acetaminophen (TYLENOL) 325 MG tablet Take 650 mg by mouth every 6 (six) hours as needed.    . calcium carbonate (OS-CAL - DOSED IN MG OF ELEMENTAL CALCIUM) 1250 (500 Ca) MG tablet Take 1 tablet by mouth daily.    . cholecalciferol (VITAMIN D) 1000 units tablet Take 1,000 Units by mouth daily.    . hydrochlorothiazide (HYDRODIURIL) 25 MG tablet Take 25 mg by mouth daily.    Marland Kitchen lisinopril (PRINIVIL,ZESTRIL) 20 MG tablet Take 1 tablet (20 mg total) by mouth daily. 90 tablet 3  . Multiple Vitamins-Minerals (MULTIVITAMIN WITH MINERALS) tablet Take 1 tablet by mouth daily.    .  pantoprazole (PROTONIX) 40 MG tablet Take 1 tablet (40 mg total) by mouth at bedtime. 90 tablet 3  . sodium fluoride (FLUORISHIELD) 1.1 % GEL dental gel Instill one drop of gel per tooth space of fluoride tray. Place over teeth for 5 minutes. Remove. Spit out excess. Repeat nightly. 120 mL prn  . vitamin E 400 UNIT capsule Take 400 Units by mouth daily.     No current facility-administered medications on file prior to visit.    BP 140/88   Temp 97.6 F (36.4 C)   Wt 208 lb (94.3 kg)   BMI 32.10 kg/m       Objective:   Physical Exam Vitals and nursing note reviewed.  Constitutional:      General: He  is not in acute distress.    Appearance: He is well-developed and overweight. He is not diaphoretic.  HENT:     Head: Normocephalic and atraumatic.     Right Ear: Tympanic membrane, ear canal and external ear normal. There is no impacted cerumen.     Left Ear: Tympanic membrane, ear canal and external ear normal. There is no impacted cerumen.     Nose: Nose normal. No congestion or rhinorrhea.     Mouth/Throat:     Mouth: Mucous membranes are moist.     Pharynx: Oropharynx is clear. No oropharyngeal exudate.  Eyes:     General:        Right eye: No discharge.        Left eye: No discharge.     Conjunctiva/sclera: Conjunctivae normal.     Pupils: Pupils are equal, round, and reactive to light.  Neck:     Thyroid: No thyromegaly.     Trachea: No tracheal deviation.  Cardiovascular:     Rate and Rhythm: Normal rate and regular rhythm.     Pulses: Normal pulses.     Heart sounds: Normal heart sounds. No murmur. No friction rub. No gallop.   Pulmonary:     Effort: Pulmonary effort is normal. No respiratory distress.     Breath sounds: Normal breath sounds. No stridor. No wheezing, rhonchi or rales.  Chest:     Chest wall: No tenderness.  Abdominal:     General: Bowel sounds are normal. There is no distension.     Palpations: Abdomen is soft. There is no mass.     Tenderness:  There is no abdominal tenderness. There is no right CVA tenderness, left CVA tenderness, guarding or rebound.     Hernia: No hernia is present.  Musculoskeletal:        General: No swelling, tenderness, deformity or signs of injury. Normal range of motion.     Cervical back: Normal range of motion and neck supple.     Right lower leg: No edema.     Left lower leg: No edema.  Lymphadenopathy:     Cervical: No cervical adenopathy.  Skin:    General: Skin is warm and dry.     Capillary Refill: Capillary refill takes less than 2 seconds.     Coloration: Skin is not jaundiced or pale.     Findings: No bruising, erythema, lesion or rash.     Comments: Old surgical scar on left face  Neurological:     General: No focal deficit present.     Mental Status: He is alert and oriented to person, place, and time. Mental status is at baseline.     Cranial Nerves: No cranial nerve deficit.     Sensory: No sensory deficit.     Motor: No weakness.     Coordination: Coordination normal.     Gait: Gait normal.     Deep Tendon Reflexes: Reflexes normal.  Psychiatric:        Mood and Affect: Mood normal.        Behavior: Behavior normal.        Thought Content: Thought content normal.        Judgment: Judgment normal.       Assessment & Plan:  /1. Routine general medical examination at a health care facility - Encouraged weight loss through diet and exercise - Follow up in one year or sooner if needed - CBC with Differential/Platelet; Future - CMP; Future - Lipid panel;  Future - TSH; Future  2. Essential hypertension - No change in medications   - CBC with Differential/Platelet; Future - CMP; Future - Lipid panel; Future - TSH; Future - hydrochlorothiazide (HYDRODIURIL) 25 MG tablet; Take 1 tablet (25 mg total) by mouth daily.  Dispense: 90 tablet; Refill: 3 - lisinopril (ZESTRIL) 40 MG tablet; Take 1 tablet (40 mg total) by mouth daily.  Dispense: 90 tablet; Refill: 3  3.  Gastroesophageal reflux disease with esophagitis without hemorrhage  - pantoprazole (PROTONIX) 40 MG tablet; Take 1 tablet (40 mg total) by mouth at bedtime.  Dispense: 90 tablet; Refill: 3 - CBC with Differential/Platelet; Future - CMP; Future - Lipid panel; Future - TSH; Future  4. Prostate cancer screening  - PSA; Future  5. Colon cancer screening  - Screening Colonoscopy; Future    [name], NP

## 2019-07-08 ENCOUNTER — Other Ambulatory Visit (INDEPENDENT_AMBULATORY_CARE_PROVIDER_SITE_OTHER): Payer: BC Managed Care – PPO

## 2019-07-08 ENCOUNTER — Other Ambulatory Visit: Payer: Self-pay

## 2019-07-08 DIAGNOSIS — Z125 Encounter for screening for malignant neoplasm of prostate: Secondary | ICD-10-CM | POA: Diagnosis not present

## 2019-07-08 DIAGNOSIS — K2100 Gastro-esophageal reflux disease with esophagitis, without bleeding: Secondary | ICD-10-CM | POA: Diagnosis not present

## 2019-07-08 DIAGNOSIS — I10 Essential (primary) hypertension: Secondary | ICD-10-CM | POA: Diagnosis not present

## 2019-07-08 DIAGNOSIS — Z0000 Encounter for general adult medical examination without abnormal findings: Secondary | ICD-10-CM | POA: Diagnosis not present

## 2019-07-08 LAB — COMPREHENSIVE METABOLIC PANEL
ALT: 21 U/L (ref 0–53)
AST: 24 U/L (ref 0–37)
Albumin: 4.6 g/dL (ref 3.5–5.2)
Alkaline Phosphatase: 44 U/L (ref 39–117)
BUN: 20 mg/dL (ref 6–23)
CO2: 32 mEq/L (ref 19–32)
Calcium: 9.8 mg/dL (ref 8.4–10.5)
Chloride: 99 mEq/L (ref 96–112)
Creatinine, Ser: 1.07 mg/dL (ref 0.40–1.50)
GFR: 84.69 mL/min (ref >60.00)
Glucose, Bld: 121 mg/dL — ABNORMAL HIGH (ref 70–99)
Potassium: 3.9 mEq/L (ref 3.5–5.1)
Sodium: 138 mEq/L (ref 135–145)
Total Bilirubin: 1 mg/dL (ref 0.2–1.2)
Total Protein: 7.4 g/dL (ref 6.0–8.3)

## 2019-07-08 LAB — CBC WITH DIFFERENTIAL/PLATELET
Basophils Absolute: 0.1 10*3/uL (ref 0.0–0.1)
Basophils Relative: 1.2 % (ref 0.0–3.0)
Eosinophils Absolute: 0.2 10*3/uL (ref 0.0–0.7)
Eosinophils Relative: 4.2 % (ref 0.0–5.0)
HCT: 40.5 % (ref 39.0–52.0)
Hemoglobin: 13.4 g/dL (ref 13.0–17.0)
Lymphocytes Relative: 19 % (ref 12.0–46.0)
Lymphs Abs: 0.9 10*3/uL (ref 0.7–4.0)
MCHC: 33.1 g/dL (ref 30.0–36.0)
MCV: 88.7 fl (ref 78.0–100.0)
Monocytes Absolute: 0.3 10*3/uL (ref 0.1–1.0)
Monocytes Relative: 7.2 % (ref 3.0–12.0)
Neutro Abs: 3.1 10*3/uL (ref 1.4–7.7)
Neutrophils Relative %: 68.4 % (ref 43.0–77.0)
Platelets: 190 10*3/uL (ref 150.0–400.0)
RBC: 4.57 Mil/uL (ref 4.22–5.81)
RDW: 14.6 % (ref 11.5–15.5)
WBC: 4.5 10*3/uL (ref 4.0–10.5)

## 2019-07-08 LAB — PSA: PSA: 0.79 ng/mL (ref 0.10–4.00)

## 2019-07-08 LAB — LIPID PANEL
Cholesterol: 212 mg/dL — ABNORMAL HIGH (ref 0–200)
HDL: 56.6 mg/dL (ref >39.00)
LDL Cholesterol: 132 mg/dL — ABNORMAL HIGH (ref 0–99)
NonHDL: 155.17
Total CHOL/HDL Ratio: 4
Triglycerides: 117 mg/dL (ref 0.0–149.0)
VLDL: 23.4 mg/dL (ref 0.0–40.0)

## 2019-07-08 LAB — TSH: TSH: 2.37 u[IU]/mL (ref 0.35–4.50)

## 2019-07-15 ENCOUNTER — Telehealth: Payer: Self-pay | Admitting: *Deleted

## 2019-07-15 NOTE — Telephone Encounter (Signed)
Tried returning call to patient on both numbers on file, no answer. Left message on mobile phone for patient to return call to office for lab results. Copied from [place] [phone]. Topic: General - Inquiry >> Jul 15, 2019 12:48 PM Matthew Meyer wrote: Reason for CRM: Patient is calling for lab results . No information in chart . Please advise

## 2019-07-22 ENCOUNTER — Telehealth: Payer: Self-pay | Admitting: *Deleted

## 2019-07-31 NOTE — Telephone Encounter (Signed)
Oncology Nurse Navigator Documentation  Sent fax to [facility] ENT Scheduling with request Matthew Meyer be contacted and scheduled for follow-up with Dr. Constance Holster next available.  Notification of successful fax transmission received.  Later received e-mail from [facility] indicating VMM left for Matthew Meyer offering 1/25 2:30 appt.  Gayleen Orem, RN, BSN Head & Neck Oncology Nurse [place] at [place] [phone]

## 2019-09-02 ENCOUNTER — Encounter: Payer: Self-pay | Admitting: Adult Health

## 2019-09-07 DIAGNOSIS — C07 Malignant neoplasm of parotid gland: Secondary | ICD-10-CM | POA: Diagnosis not present

## 2019-09-28 ENCOUNTER — Telehealth: Payer: Self-pay | Admitting: Adult Health

## 2019-09-28 NOTE — Telephone Encounter (Signed)
Tried to reach the pt.  Sent directly to voicemail.  Left a message for a return call.

## 2019-09-28 NOTE — Telephone Encounter (Signed)
Pt states that back in January his PCP's CMA called him about his cholesterol and he informed her that he has supplements (he does not know the name) that he can take. He is out of them now and would like to speak to the CMA   Pt can be reached at [phone]

## 2019-09-29 MED ORDER — ATORVASTATIN CALCIUM 10 MG PO TABS: 10.0000 mg | ORAL_TABLET | Freq: Every day | ORAL | 2 refills | Status: DC

## 2019-09-29 NOTE — Telephone Encounter (Signed)
Pt was returning Misty's call. She will call back later due to meeting

## 2019-09-29 NOTE — Addendum Note (Signed)
Addended by: Miles Costain T on: 09/29/2019 02:35 PM   Modules accepted: Orders

## 2019-09-29 NOTE — Telephone Encounter (Signed)
Spoke to the pt.  He states his OTC cholesterol supplement is too expensive.  Now requesting the Lipitor to be sent to the pharmacy.  Reviewed labs done in Jan.  [name] authorized Lipitor 10 mg to be sent in.  Confirmed with the pt that the rx should go to [facility] and sent by e-scribe.  Nothing further needed.  Will send to [facility] as FYI.

## 2019-12-23 ENCOUNTER — Telehealth: Payer: Self-pay | Admitting: Radiation Oncology

## 2019-12-23 NOTE — Telephone Encounter (Signed)
Matthew Meyer was ready to reschedule his lab/follow-up visit with Dr. Isidore Moos. Sent in basket to Ebony Hail., RN and [name] in nursing.

## 2019-12-24 ENCOUNTER — Other Ambulatory Visit: Payer: Self-pay

## 2019-12-24 DIAGNOSIS — Z1329 Encounter for screening for other suspected endocrine disorder: Secondary | ICD-10-CM

## 2019-12-28 IMAGING — CT CT CHEST W/ CM
2 of 3 series · 15 of 36 positions shown, 18 images · IV contrast (Omni 300)
Comparison: None.

CLINICAL DATA: Left parotid gland neoplasm with concern for
potential metastatic disease

EXAM:
CT CHEST WITH CONTRAST
TECHNIQUE: Multidetector CT imaging of the chest was performed during
intravenous contrast administration.
CONTRAST:  75mL OMNIPAQUE IOHEXOL 300 MG/ML  SOLN

[Series 3: chest with 2mm st · axial · 0.72mm/px · z∈[+1113,+1367]mm · 12 of 151 slices shown, 15 images]
[im 12/151  mediastinal]
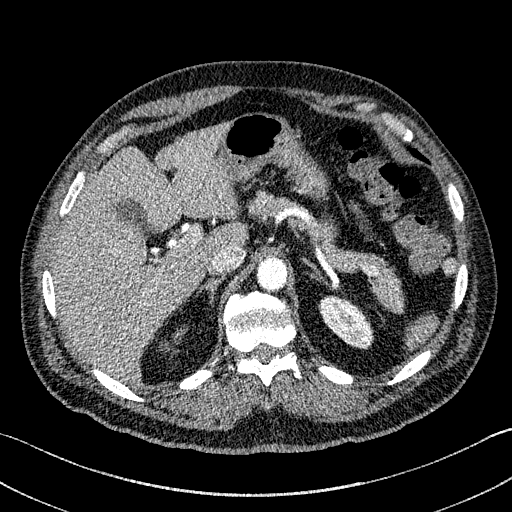
[im 12/151  lung]
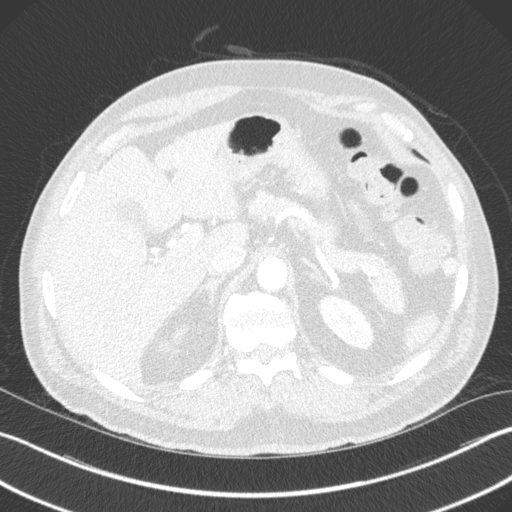
[im 23/151  lung]
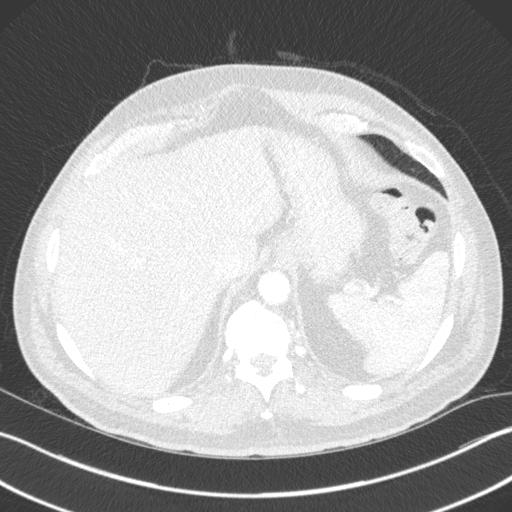
[im 34/151  lung]
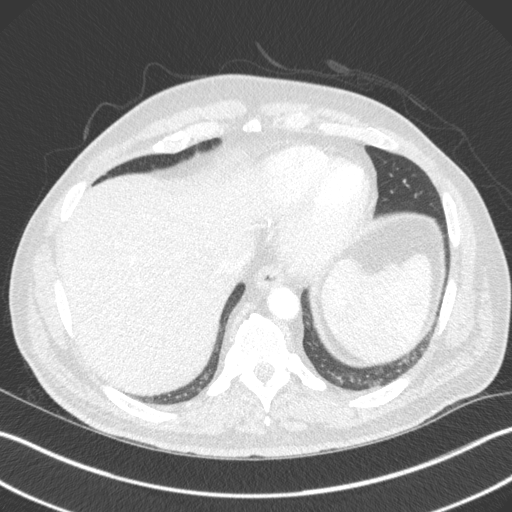
[im 45/151  lung]
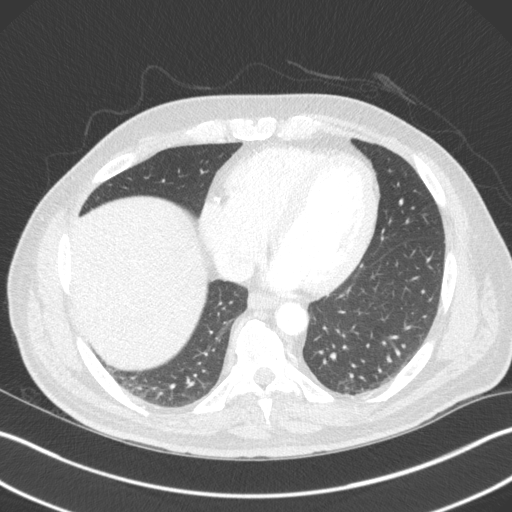
[im 56/151  mediastinal]
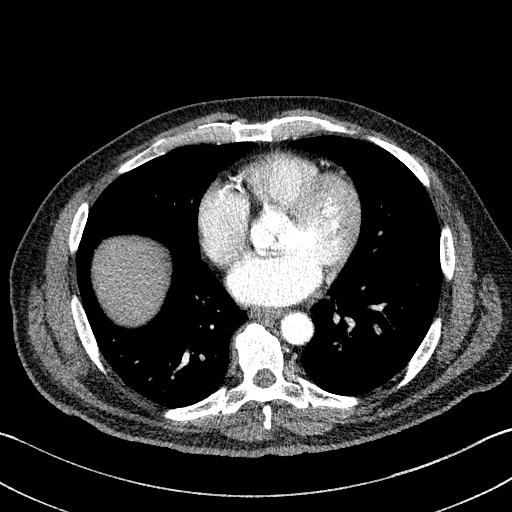
[im 56/151  lung]
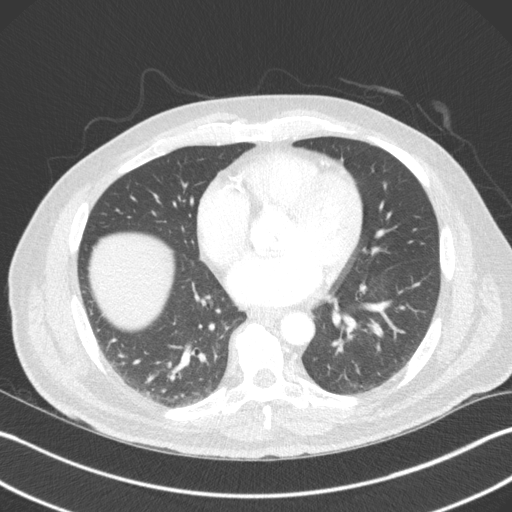
[im 67/151  lung]
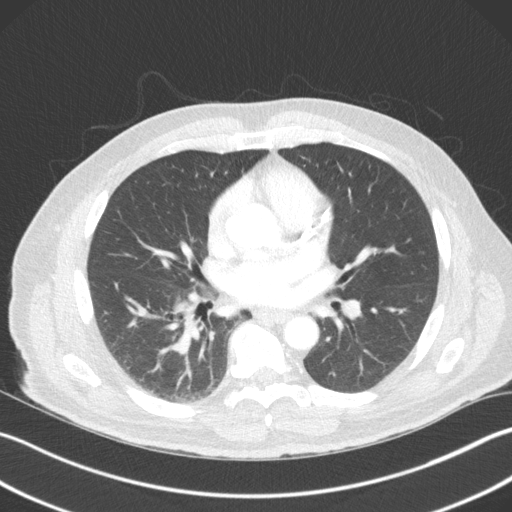
[im 84/151  lung]
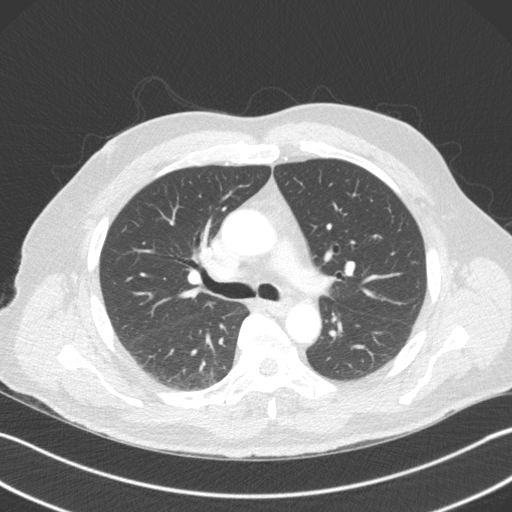
[im 95/151  lung]
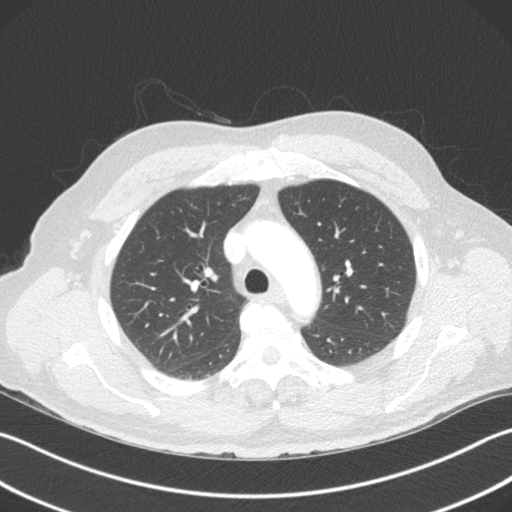
[im 106/151  mediastinal]
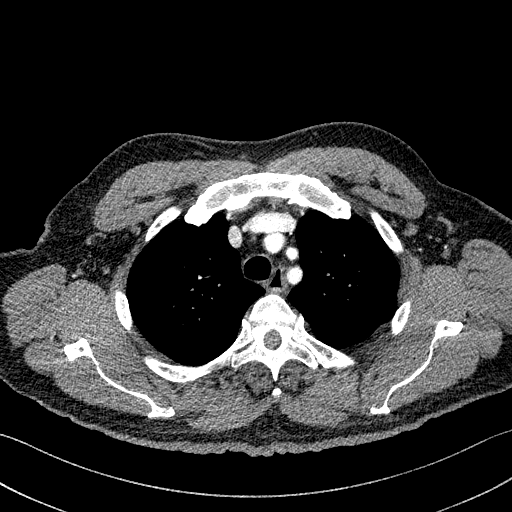
[im 106/151  lung]
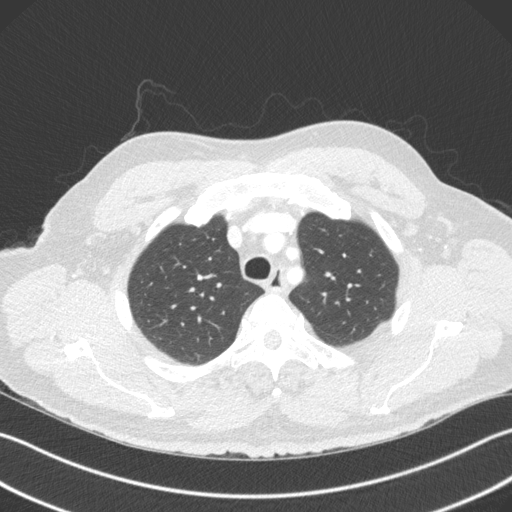
[im 117/151  lung]
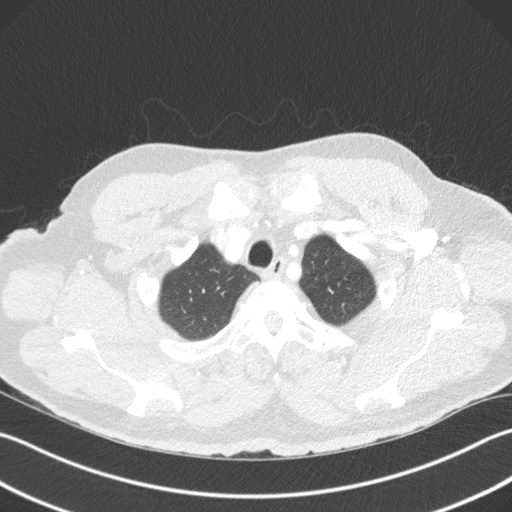
[im 128/151  lung]
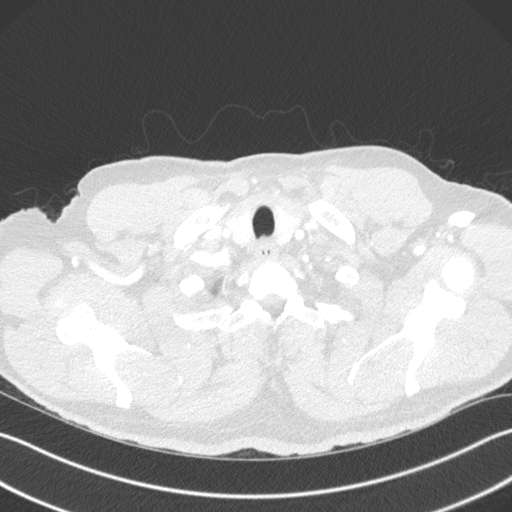
[im 139/151  lung]
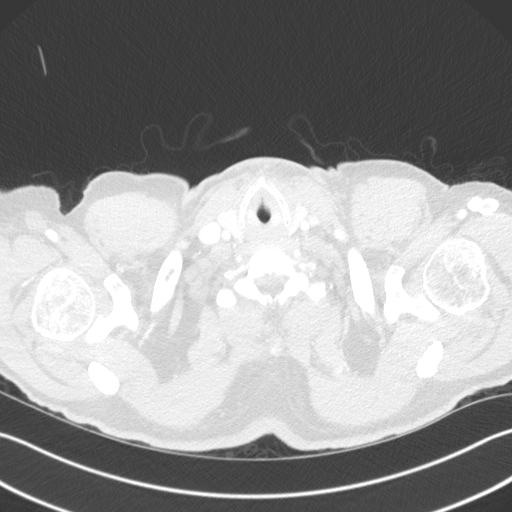

[Series 5: chest with 3mm st cor · coronal · 0.59mm/px · 3 of 79 slices shown]
[im 16/79  lung]
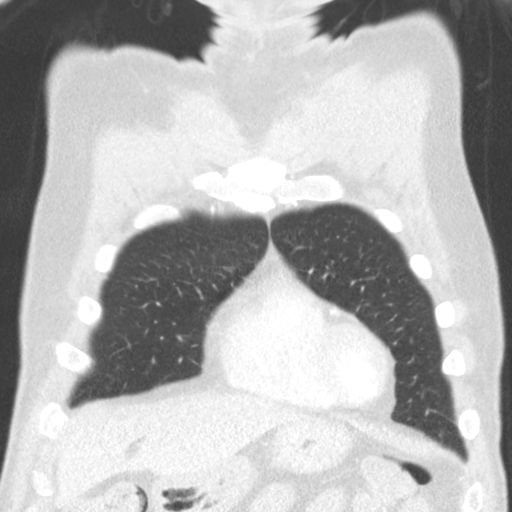
[im 32/79  lung]
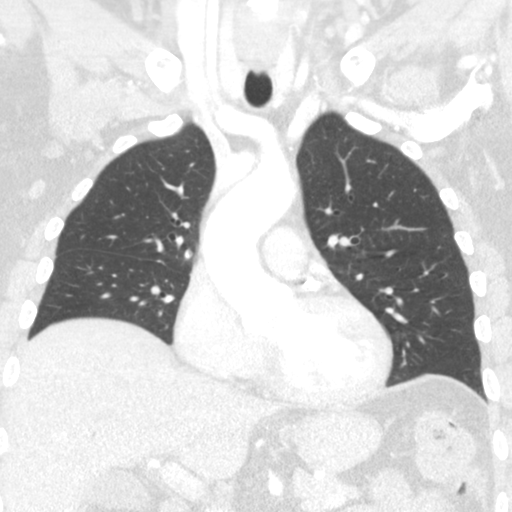
[im 47/79  lung]
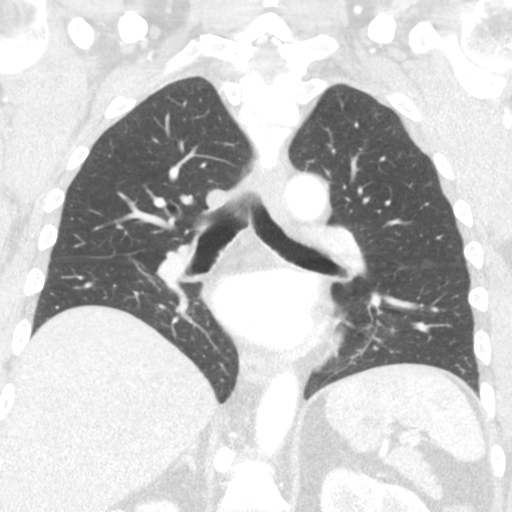

[15 of 36 positions shown; findings below may reference images not displayed]

FINDINGS: Cardiovascular: There is no thoracic aortic aneurysm or dissection.
The visualized great vessels appear normal. The left common carotid
and right innominate arteries arise as a common trunk, an anatomic
variant. There are multiple foci of coronary artery calcification.
There is no pericardial effusion or pericardial thickening. No major
vessel pulmonary embolus evident.

Mediastinum/Nodes: Thyroid appears normal. There is no appreciable
thoracic adenopathy. There are occasional subcentimeter axillary
lymph nodes bilaterally which do not meet size criteria for
pathologic significance. There is a small hiatal hernia.

Lungs/Pleura: There is slight bibasilar atelectasis. No edema or
consolidation. No pulmonary mass or nodular lesion. No pleural
effusion or pleural thickening evident.

Upper Abdomen: Visualized upper abdominal structures appear
unremarkable.

Musculoskeletal: There is degenerative change in the thoracic spine
with diffuse idiopathic skeletal hyperostosis. There are no blastic
or lytic bone lesions. No chest wall lesions are evident.
IMPRESSION: 1. No evident neoplastic focus in the thoracic region. No
parenchymal lung mass. No adenopathy by size criteria. There are a
few subcentimeter axillary lymph nodes which do not meet size
criteria for pathologic significance.

2. No lung edema or consolidation. There is slight bibasilar
atelectasis.

3.  Small hiatal hernia.

4.  Foci of coronary artery calcification noted.

5. Diffuse idiopathic skeletal hyperostosis in the thoracic spine.
No blastic or lytic bone lesions.

## 2019-12-30 ENCOUNTER — Telehealth: Payer: Self-pay | Admitting: *Deleted

## 2019-12-30 NOTE — Telephone Encounter (Signed)
CALLED PATIENT TO REMIND OF LAB APPT. FOR 12-31-19 @ 10:30 AM AND THEN HIS FU APPT. ON 12-31-19 @ 11:20 AM, SPOKE WITH PATIENT AND HE IS AWARE OF THESE APPTS.

## 2019-12-31 ENCOUNTER — Encounter: Payer: Self-pay | Admitting: Radiation Oncology

## 2019-12-31 ENCOUNTER — Ambulatory Visit
Admission: RE | Admit: 2019-12-31 | Discharge: 2019-12-31 | Disposition: A | Discharge status: Home / Self Care | Payer: BC Managed Care – PPO | Source: Ambulatory Visit | Attending: Radiation Oncology | Admitting: Radiation Oncology

## 2019-12-31 ENCOUNTER — Other Ambulatory Visit: Payer: Self-pay

## 2019-12-31 VITALS — BP 131/71 | HR 56 | Temp 97.4°F | Resp 18 | Ht 68.0 in | Wt 207.1 lb

## 2019-12-31 DIAGNOSIS — C07 Malignant neoplasm of parotid gland: Secondary | ICD-10-CM | POA: Diagnosis not present

## 2019-12-31 DIAGNOSIS — Z79899 Other long term (current) drug therapy: Secondary | ICD-10-CM | POA: Insufficient documentation

## 2019-12-31 DIAGNOSIS — Z08 Encounter for follow-up examination after completed treatment for malignant neoplasm: Secondary | ICD-10-CM | POA: Diagnosis not present

## 2019-12-31 DIAGNOSIS — Z923 Personal history of irradiation: Secondary | ICD-10-CM | POA: Insufficient documentation

## 2019-12-31 DIAGNOSIS — Z1329 Encounter for screening for other suspected endocrine disorder: Secondary | ICD-10-CM

## 2019-12-31 DIAGNOSIS — Z85858 Personal history of malignant neoplasm of other endocrine glands: Secondary | ICD-10-CM | POA: Diagnosis not present

## 2019-12-31 LAB — T4, FREE: Free T4: 0.88 ng/dL (ref 0.61–1.12)

## 2019-12-31 LAB — TSH: TSH: 1.222 u[IU]/mL (ref 0.320–4.118)

## 2019-12-31 NOTE — Progress Notes (Signed)
Matthew Meyer presents today for follow up of radiation to his left parotid/neck completed on 01/29/2018  Pain issues, if any: Patient denies Using a feeding tube?: N/A Weight changes, if any:  Wt Readings from Last 3 Encounters:  12/31/19 207 lb 2 oz (94 kg)  07/01/19 208 lb (94.3 kg)  04/29/19 205 lb (93 kg)   Swallowing issues, if any: Patient denies. He says he has a healthy appetite and is able to swallow liquids and solid food without issue Smoking or chewing tobacco? None Using fluoride trays daily? No Last ENT visit was on: 09/07/2019 Dr. [name]: "Doing well, no complaints. He is inquiring about potential for plastic surgery in the future to help even out his smile. Otherwise no complaints. Voice and breathing are clear and healthy. There are no palpable lymph nodes or masses in the neck. The left facial surgical site looks excellent with a well-healed skin graft. Coloration of the skin graft is much lighter than the surrounding skin. Intraoral exam is clear, no masses visible or palpable. Facial nerve function is excellent in the upper division and good in the lowermost aspects of the lower division but the middle branches are surgically absent. Stable, post treatment. Continue surveillance. Recheck 4 months or sooner as needed."  Other notable issues, if any: Dr. Constance Holster helped patient get fitted for hearing aide in left ear  Vitals:   12/31/19 1149  BP: 131/71  Pulse: (!) 56  Resp: 18  Temp: (!) 97.4 F (36.3 C)  SpO2: 100%

## 2020-01-03 ENCOUNTER — Encounter: Payer: Self-pay | Admitting: Radiation Oncology

## 2020-01-03 NOTE — Progress Notes (Signed)
Radiation Oncology         [phone]) [phone] ________________________________  Name: Matthew Meyer. MRN: [id number]  Date: 12/31/2019  DOB: 1956-04-07  Follow-Up Visit Note  CC: [name], NP  [name], MD  Diagnosis and Prior Radiotherapy:       ICD-10-CM   1. Cancer of parotid gland [facility])  C07    Cancer Staging Cancer of parotid gland [facility] Staging form: Major Salivary Glands, AJCC 8th Edition - Clinical stage from 10/31/2017: Stage III (cT3, cN0, cM0) - Signed by Eppie Gibson, MD on 12/09/2017  Radiation treatment dates:   12/15/17 - 01/29/18 Site/dose:   Parotid, Left and left neck / 66 Gy total dose (to positive margins), 2 Gy x 33 fractions with lower doses to at-risk surrounding tissues  CHIEF COMPLAINT:  Here for follow-up and surveillance of parotid gland cancer  Narrative:   Mr. Kielbasa presents today for follow up of radiation to his left parotid/neck completed on 01/29/2018  Pain issues, if any: Patient denies Using a feeding tube?: N/A Weight changes, if any:  STABLE Wt Readings from Last 3 Encounters:  12/31/19 207 lb 2 oz (94 kg)  07/01/19 208 lb (94.3 kg)  04/29/19 205 lb (93 kg)   Swallowing issues, if any: Patient denies. He says he has a healthy appetite and is able to swallow liquids and solid food without issue Smoking or chewing tobacco? None Using fluoride trays daily? No Last ENT visit was on: 09/07/2019 Dr. [name]: "Doing well, no complaints. He is inquiring about potential for plastic surgery in the future to help even out his smile. Otherwise no complaints. Voice and breathing are clear and healthy. There are no palpable lymph nodes or masses in the neck. The left facial surgical site looks excellent with a well-healed skin graft. Coloration of the skin graft is much lighter than the surrounding skin. Intraoral exam is clear, no masses visible or palpable. Facial nerve function is excellent in the upper division and good in the lowermost  aspects of the lower division but the middle branches are surgically absent. Stable, post treatment. Continue surveillance. Recheck 4 months or sooner as needed."  Other notable issues, if any: Dr. Constance Holster helped patient get fitted for hearing aide in left ear    ALLERGIES:  is allergic to bee pollen and pollen extract.  Meds: Current Outpatient Medications  Medication Sig Dispense Refill  . acetaminophen (TYLENOL) 325 MG tablet Take 650 mg by mouth every 6 (six) hours as needed.    Marland Kitchen atorvastatin (LIPITOR) 10 MG tablet Take 1 tablet (10 mg total) by mouth at bedtime. 90 tablet 2  . calcium carbonate (OS-CAL - DOSED IN MG OF ELEMENTAL CALCIUM) 1250 (500 Ca) MG tablet Take 1 tablet by mouth daily.    . cholecalciferol (VITAMIN D) 1000 units tablet Take 1,000 Units by mouth daily.    . hydrochlorothiazide (HYDRODIURIL) 25 MG tablet Take 1 tablet (25 mg total) by mouth daily. 90 tablet 3  . lisinopril (ZESTRIL) 40 MG tablet Take 1 tablet (40 mg total) by mouth daily. 90 tablet 3  . Multiple Vitamins-Minerals (MULTIVITAMIN WITH MINERALS) tablet Take 1 tablet by mouth daily.    . pantoprazole (PROTONIX) 40 MG tablet Take 1 tablet (40 mg total) by mouth at bedtime. 90 tablet 3  . vitamin E 400 UNIT capsule Take 400 Units by mouth daily.     No current facility-administered medications for this encounter.    Physical Findings: Wt Readings from Last  3 Encounters:  12/31/19 207 lb 2 oz (94 kg)  07/01/19 208 lb (94.3 kg)  04/29/19 205 lb (93 kg)    height is 5\' 8"  (1.727 m) and weight is 207 lb 2 oz (94 kg). His temporal temperature is 97.4 F (36.3 C) (abnormal). His blood pressure is 131/71 and his pulse is 56 (abnormal). His respiration is 18 and oxygen saturation is 100%. .  General: Alert and oriented, in no acute distress. HEENT: left facial droop. Oropharynx clear Neck: no masses palpated Lymphatics: see Neck Exam Skin:treatment field/surgical region has healed nicely Psychiatric:  Judgment and insight are intact. Affect is appropriate.   Lab Findings: Lab Results  Component Value Date   WBC 4.5 07/08/2019   HGB 13.4 07/08/2019   HCT 40.5 07/08/2019   MCV 88.7 07/08/2019   PLT 190.0 07/08/2019    Lab Results  Component Value Date   TSH 1.222 12/31/2019    Radiographic Findings: No results found.  Impression/Plan:    1) Head and Neck Cancer Status: This is a very pleasant gentleman with a history of locally advanced left parotid cancer, status post surgery and radiotherapy, now with no evidence of disease   2) Nutritional Status: stable  Wt Readings from Last 3 Encounters:  12/31/19 207 lb 2 oz (94 kg)  07/01/19 208 lb (94.3 kg)  04/29/19 205 lb (93 kg)   3) Swallowing: swallowing well, eating well  4) Dental: Encouraged to continue regular followup with dentistry, and dental hygiene including fluoride rinses.   5) Thyroid function: WNL; unlikely to be affected by RT but reasonable to screen yearly Lab Results  Component Value Date   TSH 1.222 12/31/2019   6) He is approximately 2 years out from treatment with no evidence of disease.  He will continue to follow closely with his surgeon.  I talked to him about our head and neck oncology survivorship program.  He is interested in meeting the survivorship specialist.  He will follow with them for an added layer of surveillance and see me back on an as-needed basis.  He is pleased with this plan.  On date of service, in total, I spent 20 minutes on this encounter. Patient was seen in person.    _____________________________________   Eppie Gibson, MD

## 2020-01-20 ENCOUNTER — Telehealth: Payer: Self-pay | Admitting: Medical

## 2020-01-20 NOTE — Telephone Encounter (Signed)
Scheduled appt per 7/14 sch msg - mailed reminder letter with appt date and time

## 2020-05-24 ENCOUNTER — Encounter: Payer: Self-pay | Admitting: Gastroenterology

## 2020-06-21 ENCOUNTER — Other Ambulatory Visit: Payer: Self-pay

## 2020-06-21 ENCOUNTER — Ambulatory Visit (INDEPENDENT_AMBULATORY_CARE_PROVIDER_SITE_OTHER): Payer: BC Managed Care – PPO | Admitting: Adult Health

## 2020-06-21 ENCOUNTER — Encounter: Payer: Self-pay | Admitting: Adult Health

## 2020-06-21 VITALS — BP 138/78 | HR 73 | Temp 98.4°F | Resp 18 | Wt 213.4 lb

## 2020-06-21 DIAGNOSIS — K2100 Gastro-esophageal reflux disease with esophagitis, without bleeding: Secondary | ICD-10-CM

## 2020-06-21 DIAGNOSIS — I10 Essential (primary) hypertension: Secondary | ICD-10-CM | POA: Diagnosis not present

## 2020-06-21 DIAGNOSIS — Z0000 Encounter for general adult medical examination without abnormal findings: Secondary | ICD-10-CM

## 2020-06-21 DIAGNOSIS — Z125 Encounter for screening for malignant neoplasm of prostate: Secondary | ICD-10-CM

## 2020-06-21 MED ORDER — HYDROCHLOROTHIAZIDE 25 MG PO TABS
25.0000 mg | ORAL_TABLET | Freq: Every day | ORAL | 3 refills | Status: DC
Start: 2020-06-21 — End: 2021-06-27

## 2020-06-21 MED ORDER — LISINOPRIL 40 MG PO TABS: 40.0000 mg | ORAL_TABLET | Freq: Every day | ORAL | 3 refills | Status: DC

## 2020-06-21 MED ORDER — PANTOPRAZOLE SODIUM 40 MG PO TBEC: 40.0000 mg | DELAYED_RELEASE_TABLET | Freq: Every day | ORAL | 3 refills | Status: DC

## 2020-06-21 NOTE — Progress Notes (Signed)
Subjective:    Patient ID: Matthew Meyer., male    DOB: Nov 25, 1955, 64 y.o.   MRN: [id number]  HPI Patient presents for yearly preventative medicine examination. He is a pleasant 64 year old male who  has a past medical history of Cancer ([place]), GERD (gastroesophageal reflux disease), History of radiation therapy (12/15/17- 01/29/18), and Hypertension.  Hypertension -is currently maintained on lisinopril 20 mg and HCTZ 25 mg daily.  He denies dizziness, lightheadedness, chest pain, or shortness of breath.  He does monitor his blood pressures at home periodically and reports readings in the 120s over 80s. BP Readings from Last 3 Encounters:  06/21/20 138/78  12/31/19 131/71  07/01/19 140/88   GERD - well controlled with Protonix   All immunizations and health maintenance protocols were reviewed with the patient and needed orders were placed.  Appropriate screening laboratory values were ordered for the patient including screening of hyperlipidemia, renal function and hepatic function. If indicated by BPH, a PSA was ordered.  Medication reconciliation,  past medical history, social history, problem list and allergies were reviewed in detail with the patient  Goals were established with regard to weight loss, exercise, and  diet in compliance with medications. He has not been exercising and his diet has suffered over the last year  Wt Readings from Last 3 Encounters:  06/21/20 213 lb 6.4 oz (96.8 kg)  12/31/19 207 lb 2 oz (94 kg)  07/01/19 208 lb (94.3 kg)   He has an appointment for upcoming colonoscopy in one month   He has no acute complaints   Review of Systems  Constitutional: Negative.   HENT: Negative.   Eyes: Negative.   Respiratory: Negative.   Cardiovascular: Negative.   Gastrointestinal: Negative.   Endocrine: Negative.   Genitourinary: Negative.   Musculoskeletal: Negative.   Skin: Negative.   Allergic/Immunologic: Negative.   Neurological: Negative.    Hematological: Negative.   Psychiatric/Behavioral: Negative.   All other systems reviewed and are negative.  Past Medical History:  Diagnosis Date   Cancer [facility])    Parotid cancer-Left   GERD (gastroesophageal reflux disease)    History of radiation therapy 12/15/17- 01/29/18   Left Parotid and Left Neck 66 Gy total for 33 fractions with lower doses to at risk surrounding tissues.    Hypertension     Social History   Socioeconomic History   Marital status: Single    Spouse name: Not on file   Number of children: Not on file   Years of education: Not on file   Highest education level: Not on file  Occupational History   Not on file  Tobacco Use   Smoking status: Never Smoker   Smokeless tobacco: Never Used  Vaping Use   Vaping Use: Never used  Substance and Sexual Activity   Alcohol use: Not Currently   Drug use: No   Sexual activity: Not Currently  Other Topics Concern   Not on file  Social History Narrative   Not on file   Social Determinants of Health   Financial Resource Strain: Not on file  Food Insecurity: Not on file  Transportation Needs: Not on file  Physical Activity: Not on file  Stress: Not on file  Social Connections: Not on file  Intimate Partner Violence: Not on file    Past Surgical History:  Procedure Laterality Date   APPENDECTOMY     2nd grade   LEG SURGERY Left    surgery for a broken leg when  he was 64 years old    MULTIPLE EXTRACTIONS WITH ALVEOLOPLASTY N/A 11/20/2017   Procedure: Extaction of tooth #'s 14 and 15 with alveoloplasty and gross debridement of remaining teeth;  Surgeon: Lenn Cal, DDS;  Location: [facility];  Service: Oral Surgery;  Laterality: N/A;   PAROTIDECTOMY Left 11/06/2017   Procedure: PAROTIDECTOMY;  Surgeon: [name], MD;  Location: [place];  Service: ENT;  Laterality: Left;  Possible skin graft, possible nerve graft Needs RNFA   RADICAL NECK DISSECTION Left 11/06/2017   Procedure: RADICAL  NECK DISSECTION;  Surgeon: [name], MD;  Location: [place];  Service: ENT;  Laterality: Left;   SKIN SPLIT GRAFT Left 11/06/2017   Procedure: SKIN GRAFT SPLIT THICKNESS;  Surgeon: [name], MD;  Location: [place];  Service: ENT;  Laterality: Left;    Family History  Problem Relation Age of Onset   Arthritis Mother    Hypertension Mother    Diabetes Father    Diabetes Brother     Allergies  Allergen Reactions   Bee Pollen     Caused sinus infection   Pollen Extract     Caused sinus infection    Current Outpatient Medications on File Prior to Visit  Medication Sig Dispense Refill   acetaminophen (TYLENOL) 325 MG tablet Take 650 mg by mouth every 6 (six) hours as needed.     atorvastatin (LIPITOR) 10 MG tablet Take 1 tablet (10 mg total) by mouth at bedtime. 90 tablet 2   cholecalciferol (VITAMIN D) 1000 units tablet Take 1,000 Units by mouth daily.     hydrochlorothiazide (HYDRODIURIL) 25 MG tablet Take 1 tablet (25 mg total) by mouth daily. 90 tablet 3   lisinopril (ZESTRIL) 40 MG tablet Take 1 tablet (40 mg total) by mouth daily. 90 tablet 3   Multiple Vitamins-Minerals (MULTIVITAMIN WITH MINERALS) tablet Take 1 tablet by mouth daily.     pantoprazole (PROTONIX) 40 MG tablet Take 1 tablet (40 mg total) by mouth at bedtime. 90 tablet 3   vitamin E 400 UNIT capsule Take 400 Units by mouth daily.     calcium carbonate (OS-CAL - DOSED IN MG OF ELEMENTAL CALCIUM) 1250 (500 Ca) MG tablet Take 1 tablet by mouth daily. (Patient not taking: Reported on 06/21/2020)     No current facility-administered medications on file prior to visit.    BP 138/78    Pulse 73    Temp 98.4 F (36.9 C) (Oral)    Resp 18    Wt 213 lb 6.4 oz (96.8 kg)    SpO2 98%    BMI 32.45 kg/m       Objective:   Physical Exam Vitals and nursing note reviewed.  Constitutional:      General: He is not in acute distress.    Appearance: Normal appearance. He is well-developed and normal weight.   HENT:     Head: Normocephalic and atraumatic.     Right Ear: Tympanic membrane, ear canal and external ear normal. There is no impacted cerumen.     Left Ear: Tympanic membrane, ear canal and external ear normal. There is no impacted cerumen.     Nose: Nose normal. No congestion or rhinorrhea.     Mouth/Throat:     Mouth: Mucous membranes are moist.     Pharynx: Oropharynx is clear. No oropharyngeal exudate or posterior oropharyngeal erythema.  Eyes:     General:        Right eye: No discharge.  Left eye: No discharge.     Extraocular Movements: Extraocular movements intact.     Conjunctiva/sclera: Conjunctivae normal.     Pupils: Pupils are equal, round, and reactive to light.  Neck:     Vascular: No carotid bruit.     Trachea: No tracheal deviation.  Cardiovascular:     Rate and Rhythm: Normal rate and regular rhythm.     Pulses: Normal pulses.     Heart sounds: Normal heart sounds. No murmur heard. No friction rub. No gallop.   Pulmonary:     Effort: Pulmonary effort is normal. No respiratory distress.     Breath sounds: Normal breath sounds. No stridor. No wheezing, rhonchi or rales.  Chest:     Chest wall: No tenderness.  Abdominal:     General: Bowel sounds are normal. There is no distension.     Palpations: Abdomen is soft. There is no mass.     Tenderness: There is no abdominal tenderness. There is no right CVA tenderness, left CVA tenderness, guarding or rebound.     Hernia: No hernia is present.  Musculoskeletal:        General: No swelling, tenderness, deformity or signs of injury. Normal range of motion.     Right lower leg: No edema.     Left lower leg: No edema.  Lymphadenopathy:     Cervical: No cervical adenopathy.  Skin:    General: Skin is warm and dry.     Capillary Refill: Capillary refill takes less than 2 seconds.     Coloration: Skin is not jaundiced or pale.     Findings: No bruising, erythema, lesion or rash.  Neurological:     General: No  focal deficit present.     Mental Status: He is alert and oriented to person, place, and time.     Cranial Nerves: No cranial nerve deficit.     Sensory: No sensory deficit.     Motor: No weakness.     Coordination: Coordination normal.     Gait: Gait normal.     Deep Tendon Reflexes: Reflexes normal.  Psychiatric:        Mood and Affect: Mood normal.        Behavior: Behavior normal.        Thought Content: Thought content normal.        Judgment: Judgment normal.       Assessment & Plan:  1. Routine general medical examination at a health care facility - Would like him to work on diet and exercise, needs to lose some weight. - Follow up in one year or sooner if needed - CBC with Differential/Platelet; Future - Hemoglobin A1c; Future - Lipid panel; Future - TSH; Future - CMP with eGFR([facility]); Future  2. Essential hypertension - Controled.  - CBC with Differential/Platelet; Future - Hemoglobin A1c; Future - Lipid panel; Future - TSH; Future - hydrochlorothiazide (HYDRODIURIL) 25 MG tablet; Take 1 tablet (25 mg total) by mouth daily.  Dispense: 90 tablet; Refill: 3 - lisinopril (ZESTRIL) 40 MG tablet; Take 1 tablet (40 mg total) by mouth daily.  Dispense: 90 tablet; Refill: 3  3. Gastroesophageal reflux disease with esophagitis without hemorrhage  - pantoprazole (PROTONIX) 40 MG tablet; Take 1 tablet (40 mg total) by mouth at bedtime.  Dispense: 90 tablet; Refill: 3  4. Prostate cancer screening  - PSA; Future  [name], NP

## 2020-06-21 NOTE — Patient Instructions (Signed)
It was great seeing you today   We will follow up with you regarding your blood work   Please work on diet and exercise for follow up   I will see you back in one year or sooner if needed

## 2020-06-22 ENCOUNTER — Telehealth: Payer: Self-pay | Admitting: Emergency Medicine

## 2020-06-22 ENCOUNTER — Inpatient Hospital Stay: Payer: BC Managed Care – PPO | Admitting: Medical

## 2020-06-22 ENCOUNTER — Telehealth: Payer: Self-pay | Admitting: Medical

## 2020-06-22 LAB — COMPLETE METABOLIC PANEL WITH GFR
AG Ratio: 1.7 (calc) (ref 1.0–2.5)
ALT: 23 U/L (ref 9–46)
AST: 23 U/L (ref 10–35)
Albumin: 4.7 g/dL (ref 3.6–5.1)
Alkaline phosphatase (APISO): 41 U/L (ref 35–144)
BUN: 17 mg/dL (ref 7–25)
CO2: 30 mmol/L (ref 20–32)
Calcium: 10.1 mg/dL (ref 8.6–10.3)
Chloride: 101 mmol/L (ref 98–110)
Creat: 1.02 mg/dL (ref 0.70–1.25)
GFR, Est African American: 90 mL/min/{1.73_m2} (ref >=60)
GFR, Est Non African American: 78 mL/min/{1.73_m2} (ref >=60)
Globulin: 2.8 g/dL (calc) (ref 1.9–3.7)
Glucose, Bld: 117 mg/dL — ABNORMAL HIGH (ref 65–99)
Potassium: 4.4 mmol/L (ref 3.5–5.3)
Sodium: 138 mmol/L (ref 135–146)
Total Bilirubin: 1 mg/dL (ref 0.2–1.2)
Total Protein: 7.5 g/dL (ref 6.1–8.1)

## 2020-06-22 LAB — CBC WITH DIFFERENTIAL/PLATELET
Absolute Monocytes: 270 cells/uL (ref 200–950)
Basophils Absolute: 50 cells/uL (ref 0–200)
Basophils Relative: 1.1 %
Eosinophils Absolute: 131 cells/uL (ref 15–500)
Eosinophils Relative: 2.9 %
HCT: 38.5 % (ref 38.5–50.0)
Hemoglobin: 13 g/dL — ABNORMAL LOW (ref 13.2–17.1)
Lymphs Abs: 648 cells/uL — ABNORMAL LOW (ref 850–3900)
MCH: 29.2 pg (ref 27.0–33.0)
MCHC: 33.8 g/dL (ref 32.0–36.0)
MCV: 86.5 fL (ref 80.0–100.0)
MPV: 10.4 fL (ref 7.5–12.5)
Monocytes Relative: 6 %
Neutro Abs: 3402 cells/uL (ref 1500–7800)
Neutrophils Relative %: 75.6 %
Platelets: 197 10*3/uL (ref 140–400)
RBC: 4.45 10*6/uL (ref 4.20–5.80)
RDW: 13.6 % (ref 11.0–15.0)
Total Lymphocyte: 14.4 %
WBC: 4.5 10*3/uL (ref 3.8–10.8)

## 2020-06-22 LAB — TSH: TSH: 1.33 mIU/L (ref 0.40–4.50)

## 2020-06-22 LAB — HEMOGLOBIN A1C
Hgb A1c MFr Bld: 5.8 % of total Hgb — ABNORMAL HIGH (ref <5.7)
Mean Plasma Glucose: 120 mg/dL
eAG (mmol/L): 6.6 mmol/L

## 2020-06-22 LAB — PSA: PSA: 0.75 ng/mL (ref <=4.0)

## 2020-06-22 LAB — LIPID PANEL
Cholesterol: 135 mg/dL (ref <200)
HDL: 55 mg/dL (ref >=40)
LDL Cholesterol (Calc): 63 mg/dL (calc)
Non-HDL Cholesterol (Calc): 80 mg/dL (calc) (ref <130)
Total CHOL/HDL Ratio: 2.5 (calc) (ref <5.0)
Triglycerides: 87 mg/dL (ref <150)

## 2020-06-22 NOTE — Telephone Encounter (Signed)
Called pt regarding missed appt this am for Survivorship.  Pt states he called Tuesday to cancel his appt d/t a funeral he needed to attend today.  Pt requests to be called back next Wednesday (12/22) to reschedule this appt at another time.  Appt cancelled for today, scheduling message sent.

## 2020-06-22 NOTE — Telephone Encounter (Signed)
Called pt per 12/16 sch msg - left message for pt to call back to reschedule appt.

## 2020-06-23 ENCOUNTER — Ambulatory Visit (AMBULATORY_SURGERY_CENTER): Payer: Self-pay

## 2020-06-23 ENCOUNTER — Other Ambulatory Visit: Payer: Self-pay

## 2020-06-23 VITALS — Ht 67.0 in | Wt 214.0 lb

## 2020-06-23 DIAGNOSIS — Z1211 Encounter for screening for malignant neoplasm of colon: Secondary | ICD-10-CM

## 2020-06-23 MED ORDER — PEG 3350-KCL-NA BICARB-NACL 420 G PO SOLR: 4000.0000 mL | Freq: Once | ORAL | 0 refills | Status: AC

## 2020-06-23 NOTE — Progress Notes (Signed)

## 2020-06-26 ENCOUNTER — Other Ambulatory Visit: Payer: Self-pay | Admitting: Adult Health

## 2020-07-14 ENCOUNTER — Encounter: Payer: Self-pay | Admitting: Gastroenterology

## 2020-07-24 ENCOUNTER — Encounter: Payer: BC Managed Care – PPO | Admitting: Gastroenterology

## 2020-09-25 ENCOUNTER — Encounter: Payer: BC Managed Care – PPO | Admitting: Gastroenterology

## 2020-09-27 ENCOUNTER — Encounter: Payer: Self-pay | Admitting: Gastroenterology

## 2020-10-05 ENCOUNTER — Other Ambulatory Visit: Payer: Self-pay

## 2020-10-06 ENCOUNTER — Encounter: Payer: Self-pay | Admitting: Adult Health

## 2020-10-06 ENCOUNTER — Ambulatory Visit (INDEPENDENT_AMBULATORY_CARE_PROVIDER_SITE_OTHER): Payer: BC Managed Care – PPO | Admitting: Adult Health

## 2020-10-06 VITALS — BP 136/80 | HR 63 | Temp 98.2°F | Ht 67.0 in | Wt 218.4 lb

## 2020-10-06 DIAGNOSIS — M5442 Lumbago with sciatica, left side: Secondary | ICD-10-CM | POA: Diagnosis not present

## 2020-10-06 MED ORDER — KETOROLAC TROMETHAMINE 60 MG/2ML IM SOLN: 60.0000 mg | Freq: Once | INTRAMUSCULAR | Status: DC

## 2020-10-06 MED ORDER — METHYLPREDNISOLONE ACETATE 80 MG/ML IJ SUSP
80.0000 mg | Freq: Once | INTRAMUSCULAR | Status: AC
  Administered 2020-10-06: 80 mg via INTRAMUSCULAR

## 2020-10-06 MED ORDER — METHYLPREDNISOLONE ACETATE 80 MG/ML IJ SUSP: 80.0000 mg | Freq: Once | INTRAMUSCULAR | Status: DC

## 2020-10-06 MED ORDER — KETOROLAC TROMETHAMINE 60 MG/2ML IM SOLN
60.0000 mg | Freq: Once | INTRAMUSCULAR | Status: AC
  Administered 2020-10-06: 60 mg via INTRAMUSCULAR

## 2020-10-06 MED ORDER — METHYLPREDNISOLONE 4 MG PO TBPK: ORAL_TABLET | ORAL | 0 refills | Status: DC

## 2020-10-06 NOTE — Patient Instructions (Addendum)
It was great seeing you today.   I am treating you for left sided sciatica pain. I have given you a steroid shot and a shot of Tramadol ( pain reliever ) in the office today   I have also sent in a medrol dose pack. Start this tomorrow - take with food  Let me know if your pain does not resolve

## 2020-10-06 NOTE — Progress Notes (Signed)
Subjective:    Patient ID: Matthew Heads., male    DOB: September 05, 1955, 65 y.o.   MRN: [id number]  HPI  65 year old male who  has a past medical history of Allergy, Cancer ([place]), GERD (gastroesophageal reflux disease), History of radiation therapy (12/15/17- 01/29/18), Hyperlipidemia, and Hypertension.  Presents to the office today for an acute issue of left-sided low back pain with pain radiating down his left leg.  He has endured this in the past, last being back in October 2020.  Reports that the pain is identical to what he has experienced in the past.  Pain started this week, is worse with sitting for long periods of time as well as change in position.  Pain is described as "an ache and shooting pain".  He has been using Tylenol and Aleve with relief but not resolution  He denies falls or injury  Review of Systems See HPI   Past Medical History:  Diagnosis Date  . Allergy    pollen  . Cancer ([facility])    Parotid cancer-Left  . GERD (gastroesophageal reflux disease)   . History of radiation therapy 12/15/17- 01/29/18   Left Parotid and Left Neck 66 Gy total for 33 fractions with lower doses to at risk surrounding tissues.   . Hyperlipidemia   . Hypertension     Social History   Socioeconomic History  . Marital status: Single    Spouse name: Not on file  . Number of children: Not on file  . Years of education: Not on file  . Highest education level: Not on file  Occupational History  . Not on file  Tobacco Use  . Smoking status: Never Smoker  . Smokeless tobacco: Never Used  Vaping Use  . Vaping Use: Never used  Substance and Sexual Activity  . Alcohol use: Not Currently  . Drug use: No  . Sexual activity: Not Currently  Other Topics Concern  . Not on file  Social History Narrative  . Not on file   Social Determinants of Health   Financial Resource Strain: Not on file  Food Insecurity: Not on file  Transportation Needs: Not on file  Physical Activity: Not  on file  Stress: Not on file  Social Connections: Not on file  Intimate Partner Violence: Not on file    Past Surgical History:  Procedure Laterality Date  . APPENDECTOMY     2nd grade  . LEG SURGERY Left    surgery for a broken leg when he was 65 years old   . MULTIPLE EXTRACTIONS WITH ALVEOLOPLASTY N/A 11/20/2017   Procedure: Extaction of tooth #'s 14 and 15 with alveoloplasty and gross debridement of remaining teeth;  Surgeon: Lenn Cal, DDS;  Location: [facility];  Service: Oral Surgery;  Laterality: N/A;  . PAROTIDECTOMY Left 11/06/2017   Procedure: PAROTIDECTOMY;  Surgeon: [name], MD;  Location: [facility];  Service: ENT;  Laterality: Left;  Possible skin graft, possible nerve graft Needs RNFA  . RADICAL NECK DISSECTION Left 11/06/2017   Procedure: RADICAL NECK DISSECTION;  Surgeon: [name], MD;  Location: [place];  Service: ENT;  Laterality: Left;  . SKIN SPLIT GRAFT Left 11/06/2017   Procedure: SKIN GRAFT SPLIT THICKNESS;  Surgeon: [name], MD;  Location: [facility];  Service: ENT;  Laterality: Left;    Family History  Problem Relation Age of Onset  . Arthritis Mother   . Hypertension Mother   . Diabetes Father   . Diabetes Brother   .  Colon cancer Neg Hx   . Colon polyps Neg Hx   . Esophageal cancer Neg Hx   . Rectal cancer Neg Hx   . Stomach cancer Neg Hx     Allergies  Allergen Reactions  . Bee Pollen     Caused sinus infection  . Pollen Extract     Caused sinus infection    Current Outpatient Medications on File Prior to Visit  Medication Sig Dispense Refill  . acetaminophen (TYLENOL) 325 MG tablet Take 650 mg by mouth every 6 (six) hours as needed.    Marland Kitchen atorvastatin (LIPITOR) 10 MG tablet TAKE ONE TABLET BY MOUTH DAILY AT BEDTIME 90 tablet 1  . cholecalciferol (VITAMIN D) 1000 units tablet Take 1,000 Units by mouth daily.    . hydrochlorothiazide (HYDRODIURIL) 25 MG tablet Take 1 tablet (25 mg total) by mouth daily. 90 tablet 3  . lisinopril (ZESTRIL)  40 MG tablet Take 1 tablet (40 mg total) by mouth daily. 90 tablet 3  . Magnesium 400 MG TABS Take by mouth.    . Multiple Vitamins-Minerals (MULTIVITAMIN WITH MINERALS) tablet Take 1 tablet by mouth daily.    . Nutritional Supplements (CHOLESTEROL DEFENSE PO) Take by mouth.    . pantoprazole (PROTONIX) 40 MG tablet Take 1 tablet (40 mg total) by mouth at bedtime. 90 tablet 3  . vitamin E 400 UNIT capsule Take 400 Units by mouth daily.     No current facility-administered medications on file prior to visit.    BP 136/80 (BP Location: Left Arm, Patient Position: Sitting, Cuff Size: Normal)   Pulse 63   Temp 98.2 F (36.8 C) (Oral)   Ht 5\' 7"  (1.702 m)   Wt 218 lb 6.4 oz (99.1 kg)   SpO2 97%   BMI 34.21 kg/m       Objective:   Physical Exam Vitals and nursing note reviewed.  Constitutional:      Appearance: Normal appearance.  Musculoskeletal:        General: Tenderness (left lateral leg. ) present. Normal range of motion.     Comments: Has obvious discomfort when standing from a sitting position as well as with walking  Skin:    General: Skin is warm and dry.  Neurological:     General: No focal deficit present.     Mental Status: He is alert and oriented to person, place, and time.  Psychiatric:        Mood and Affect: Mood normal.        Behavior: Behavior normal.        Thought Content: Thought content normal.        Judgment: Judgment normal.       Assessment & Plan:  1. Acute left-sided low back pain with left-sided sciatica - methylPREDNISolone acetate (DEPO-MEDROL) injection 80 mg - methylPREDNISolone (MEDROL DOSEPAK) 4 MG TBPK tablet; Take as directed  Dispense: 21 tablet; Refill: 0 - ketorolac (TORADOL) injection 60 mg  [name], NP

## 2020-12-18 ENCOUNTER — Other Ambulatory Visit: Payer: Self-pay

## 2020-12-18 ENCOUNTER — Ambulatory Visit (AMBULATORY_SURGERY_CENTER): Payer: BC Managed Care – PPO | Admitting: *Deleted

## 2020-12-18 VITALS — Ht 67.0 in | Wt 213.0 lb

## 2020-12-18 DIAGNOSIS — Z1211 Encounter for screening for malignant neoplasm of colon: Secondary | ICD-10-CM

## 2020-12-18 NOTE — Progress Notes (Signed)
No egg or soy allergy known to patient  No issues with past sedation with any surgeries or procedures Patient denies ever being told they had issues or difficulty with intubation  No FH of Malignant Hyperthermia No diet pills per patient No home 02 use per patient  No blood thinners per patient  Pt denies issues with constipation  No A fib or A flutter  EMMI video to pt or via [place] 19 guidelines implemented in [place] today with Pt and RN  Pt is fully vaccinated  for Covid    Due to the COVID-19 pandemic we are asking patients to follow certain guidelines.  Pt aware of COVID protocols and [facility] guidelines    Pt has a golytely prep at home from 06-2020 PV

## 2021-01-01 ENCOUNTER — Other Ambulatory Visit: Payer: Self-pay | Admitting: Adult Health

## 2021-01-01 ENCOUNTER — Other Ambulatory Visit: Payer: Self-pay

## 2021-01-01 ENCOUNTER — Encounter: Payer: Self-pay | Admitting: Gastroenterology

## 2021-01-01 ENCOUNTER — Ambulatory Visit (AMBULATORY_SURGERY_CENTER): Payer: BC Managed Care – PPO | Admitting: Gastroenterology

## 2021-01-01 VITALS — BP 125/68 | HR 57 | Temp 97.3°F | Resp 16 | Ht 67.0 in | Wt 213.0 lb

## 2021-01-01 DIAGNOSIS — D128 Benign neoplasm of rectum: Secondary | ICD-10-CM | POA: Diagnosis not present

## 2021-01-01 DIAGNOSIS — Z1211 Encounter for screening for malignant neoplasm of colon: Secondary | ICD-10-CM | POA: Diagnosis not present

## 2021-01-01 DIAGNOSIS — D122 Benign neoplasm of ascending colon: Secondary | ICD-10-CM | POA: Diagnosis not present

## 2021-01-01 DIAGNOSIS — D123 Benign neoplasm of transverse colon: Secondary | ICD-10-CM

## 2021-01-01 HISTORY — PX: COLONOSCOPY: SHX174

## 2021-01-01 MED ORDER — SODIUM CHLORIDE 0.9 % IV SOLN
500.0000 mL | Freq: Once | INTRAVENOUS | Status: DC
Start: 2021-01-01 — End: 2021-01-01

## 2021-01-01 NOTE — Progress Notes (Signed)
Called to room to assist during endoscopic procedure.  Patient ID and intended procedure confirmed with present staff. Received instructions for my participation in the procedure from the performing physician.  

## 2021-01-01 NOTE — Progress Notes (Signed)
Pt's states no medical or surgical changes since previsit or office visit.  ° °Vitals CW °

## 2021-01-01 NOTE — Progress Notes (Signed)
Report to PACU, RN, vss, BBS= Clear.  

## 2021-01-01 NOTE — Op Note (Addendum)
[name] Procedure Date: 01/01/2021 1:21 PM MRN: [id number] Endoscopist: Thornton Park MD, MD Age: 65 Referring MD:  Date of Birth: [birth year] Gender: Male Account #: [id number] Procedure:                Colonoscopy Indications:              Screening for colorectal malignant neoplasm, This                            is the patient's first colonoscopy Medicines:                Monitored Anesthesia Care Procedure:                Pre-Anesthesia Assessment:                           - Prior to the procedure, a History and Physical                            was performed, and patient medications and                            allergies were reviewed. The patient's tolerance of                            previous anesthesia was also reviewed. The risks                            and benefits of the procedure and the sedation                            options and risks were discussed with the patient.                            All questions were answered, and informed consent                            was obtained. Prior Anticoagulants: The patient has                            taken no previous anticoagulant or antiplatelet                            agents. ASA Grade Assessment: II - A patient with                            mild systemic disease. After reviewing the risks                            and benefits, the patient was deemed in                            satisfactory condition to undergo the procedure.  After obtaining informed consent, the colonoscope                            was passed under direct vision. Throughout the                            procedure, the patient's blood pressure, pulse, and                            oxygen saturations were monitored continuously. The                            CF [id number] #[id number] was introduced through the anus                            and advanced to the 3  cm into the ileum. The                            colonoscopy was performed with moderate difficulty                            due to a redundant colon, significant looping and a                            tortuous colon. Successful completion of the                            procedure was aided by changing the patient's                            position, withdrawing and reinserting the scope and                            applying abdominal pressure. The patient tolerated                            the procedure well. The quality of the bowel                            preparation was good. Over 73mL of liquid were                            removed during the procedure. The terminal ileum,                            ileocecal valve, appendiceal orifice, and rectum                            were photographed. Scope In: 1:27:33 PM Scope Out: 1:53:27 PM Scope Withdrawal Time: 0 hours 20 minutes 6 seconds  Total Procedure Duration: 0 hours 25 minutes 54 seconds  Findings:                 The perianal and digital rectal examinations  were                            normal.                           A 3 mm polyp was found in the rectum. The polyp was                            flat. The polyp was removed with a cold snare.                            Resection and retrieval were complete. Estimated                            blood loss was minimal.                           A 20 mm polyp was found in the rectum, located 18                            cm from the anal verge. The polyp was pedunculated.                            The polyp was removed with a hot snare. Resection                            and retrieval were complete. Area was tattooed with                            an injection of 2 mL of Spot (carbon black).                           Two sessile polyps were found in the hepatic                            flexure and ascending colon. The polyps were 2 to 3                             mm in size. These polyps were removed with a cold                            snare. Resection and retrieval were complete.                            Estimated blood loss was minimal.                           The exam was otherwise without abnormality on                            direct and retroflexion views. Complications:            No immediate complications.  Estimated blood loss:                            Minimal. Estimated Blood Loss:     Estimated blood loss was minimal. Impression:               - One 3 mm polyp in the rectum, removed with a cold                            snare. Resected and retrieved.                           - One 20 mm polyp in the rectum, removed with a hot                            snare. Resected and retrieved. Tattooed.                           - Two 2 to 3 mm polyps at the hepatic flexure and                            in the ascending colon, removed with a cold snare.                            Resected and retrieved.                           - The examination was otherwise normal on direct                            and retroflexion views. Recommendation:           - Patient has a contact number available for                            emergencies. The signs and symptoms of potential                            delayed complications were discussed with the                            patient. Return to normal activities tomorrow.                            Written discharge instructions were provided to the                            patient.                           - Resume previous diet.                           - Continue present medications.                           -  Await pathology results.                           - Repeat colonoscopy date to be determined after                            pending pathology results are reviewed for                            surveillance.                           - Emerging evidence supports  eating a diet of                            fruits, vegetables, grains, calcium, and yogurt                            while reducing red meat and alcohol may reduce the                            risk of colon cancer.                           - Given these results, all first degree relatives                            (brothers, sisters, children, parents) should start                            colon cancer screening at age 89.                           - Thank you for allowing me to be involved in your                            colon cancer prevention. Thornton Park MD, MD 01/01/2021 1:59:58 PM This report has been signed electronically.

## 2021-01-01 NOTE — Patient Instructions (Signed)
YOU HAD AN ENDOSCOPIC PROCEDURE TODAY AT THE [facility]:   Refer to the procedure report that was given to you for any specific questions about what was found during the examination.  If the procedure report does not answer your questions, please call your gastroenterologist to clarify.  If you requested that your care partner not be given the details of your procedure findings, then the procedure report has been included in a sealed envelope for you to review at your convenience later.  YOU SHOULD EXPECT: Some feelings of bloating in the abdomen. Passage of more gas than usual.  Walking can help get rid of the air that was put into your GI tract during the procedure and reduce the bloating. If you had a lower endoscopy (such as a colonoscopy or flexible sigmoidoscopy) you may notice spotting of blood in your stool or on the toilet paper. If you underwent a bowel prep for your procedure, you may not have a normal bowel movement for a few days.  Please Note:  You might notice some irritation and congestion in your nose or some drainage.  This is from the oxygen used during your procedure.  There is no need for concern and it should clear up in a day or so.  SYMPTOMS TO REPORT IMMEDIATELY:   Following lower endoscopy (colonoscopy or flexible sigmoidoscopy):  Excessive amounts of blood in the stool  Significant tenderness or worsening of abdominal pains  Swelling of the abdomen that is new, acute  Fever of 100F or higher   Following upper endoscopy (EGD)  Vomiting of blood or coffee ground material  New chest pain or pain under the shoulder blades  Painful or persistently difficult swallowing  New shortness of breath  Fever of 100F or higher  Black, tarry-looking stools  For urgent or emergent issues, a gastroenterologist can be reached at any hour by calling [phone]. Do not use MyChart messaging for urgent concerns.    DIET:  We do recommend a small meal at first, but  then you may proceed to your regular diet.  Drink plenty of fluids but you should avoid alcoholic beverages for 24 hours.  ACTIVITY:  You should plan to take it easy for the rest of today and you should NOT DRIVE or use heavy machinery until tomorrow (because of the sedation medicines used during the test).    FOLLOW UP: Our staff will call the number listed on your records 48-72 hours following your procedure to check on you and address any questions or concerns that you may have regarding the information given to you following your procedure. If we do not reach you, we will leave a message.  We will attempt to reach you two times.  During this call, we will ask if you have developed any symptoms of COVID 19. If you develop any symptoms (ie: fever, flu-like symptoms, shortness of breath, cough etc.) before then, please call [phone].  If you test positive for Covid 19 in the 2 weeks post procedure, please call and report this information to us.    If any biopsies were taken you will be contacted by phone or by letter within the next 1-3 weeks.  Please call us at [phone] if you have not heard about the biopsies in 3 weeks.    SIGNATURES/CONFIDENTIALITY: You and/or your care partner have signed paperwork which will be entered into your electronic medical record.  These signatures attest to the fact that that the information above on   your After Visit Summary has been reviewed and is understood.  Full responsibility of the confidentiality of this discharge information lies with you and/or your care-partner. 

## 2021-01-03 ENCOUNTER — Telehealth: Payer: Self-pay

## 2021-01-03 NOTE — Telephone Encounter (Signed)
  Follow up Call-  Call back number 01/01/2021  Post procedure Call Back phone  # [phone]  Permission to leave phone message Yes  Some recent data might be hidden     Patient questions:  Do you have a fever, pain , or abdominal swelling? No. Pain Score  0 *  Have you tolerated food without any problems? Yes.    Have you been able to return to your normal activities? Yes.    Do you have any questions about your discharge instructions: Diet   No. Medications  No. Follow up visit  No.  Do you have questions or concerns about your Care? No.  Actions: * If pain score is 4 or above: No action needed, pain <4.

## 2021-01-03 NOTE — Telephone Encounter (Signed)
No answer, left message to call back later today, B.Meoshia Billing RN. 

## 2021-01-23 ENCOUNTER — Encounter: Payer: Self-pay | Admitting: Gastroenterology

## 2021-04-09 ENCOUNTER — Other Ambulatory Visit: Payer: Self-pay | Admitting: Adult Health

## 2021-04-26 DIAGNOSIS — Z23 Encounter for immunization: Secondary | ICD-10-CM | POA: Diagnosis not present

## 2021-06-22 ENCOUNTER — Encounter: Payer: BC Managed Care – PPO | Admitting: Adult Health

## 2021-06-27 ENCOUNTER — Ambulatory Visit (INDEPENDENT_AMBULATORY_CARE_PROVIDER_SITE_OTHER): Payer: BC Managed Care – PPO | Admitting: Adult Health

## 2021-06-27 ENCOUNTER — Encounter: Payer: Self-pay | Admitting: Adult Health

## 2021-06-27 VITALS — BP 110/68 | HR 70 | Temp 98.9°F | Ht 67.25 in | Wt 221.0 lb

## 2021-06-27 DIAGNOSIS — Z125 Encounter for screening for malignant neoplasm of prostate: Secondary | ICD-10-CM

## 2021-06-27 DIAGNOSIS — I10 Essential (primary) hypertension: Secondary | ICD-10-CM

## 2021-06-27 DIAGNOSIS — Z0001 Encounter for general adult medical examination with abnormal findings: Secondary | ICD-10-CM | POA: Diagnosis not present

## 2021-06-27 DIAGNOSIS — E782 Mixed hyperlipidemia: Secondary | ICD-10-CM | POA: Diagnosis not present

## 2021-06-27 DIAGNOSIS — K2100 Gastro-esophageal reflux disease with esophagitis, without bleeding: Secondary | ICD-10-CM

## 2021-06-27 LAB — CBC WITH DIFFERENTIAL/PLATELET
Basophils Absolute: 0.1 10*3/uL (ref 0.0–0.1)
Basophils Relative: 1.1 % (ref 0.0–3.0)
Eosinophils Absolute: 0.1 10*3/uL (ref 0.0–0.7)
Eosinophils Relative: 3 % (ref 0.0–5.0)
HCT: 39.9 % (ref 39.0–52.0)
Hemoglobin: 13.4 g/dL (ref 13.0–17.0)
Lymphocytes Relative: 18.6 % (ref 12.0–46.0)
Lymphs Abs: 0.9 10*3/uL (ref 0.7–4.0)
MCHC: 33.5 g/dL (ref 30.0–36.0)
MCV: 86.1 fl (ref 78.0–100.0)
Monocytes Absolute: 0.4 10*3/uL (ref 0.1–1.0)
Monocytes Relative: 8 % (ref 3.0–12.0)
Neutro Abs: 3.2 10*3/uL (ref 1.4–7.7)
Neutrophils Relative %: 69.3 % (ref 43.0–77.0)
Platelets: 188 10*3/uL (ref 150.0–400.0)
RBC: 4.63 Mil/uL (ref 4.22–5.81)
RDW: 14.2 % (ref 11.5–15.5)
WBC: 4.6 10*3/uL (ref 4.0–10.5)

## 2021-06-27 LAB — COMPREHENSIVE METABOLIC PANEL
ALT: 22 U/L (ref 0–53)
AST: 23 U/L (ref 0–37)
Albumin: 4.6 g/dL (ref 3.5–5.2)
Alkaline Phosphatase: 48 U/L (ref 39–117)
BUN: 20 mg/dL (ref 6–23)
CO2: 31 mEq/L (ref 19–32)
Calcium: 10.1 mg/dL (ref 8.4–10.5)
Chloride: 100 mEq/L (ref 96–112)
Creatinine, Ser: 1.18 mg/dL (ref 0.40–1.50)
GFR: 65.38 mL/min (ref >60.00)
Glucose, Bld: 106 mg/dL — ABNORMAL HIGH (ref 70–99)
Potassium: 3.8 mEq/L (ref 3.5–5.1)
Sodium: 138 mEq/L (ref 135–145)
Total Bilirubin: 1.2 mg/dL (ref 0.2–1.2)
Total Protein: 7.6 g/dL (ref 6.0–8.3)

## 2021-06-27 LAB — LIPID PANEL
Cholesterol: 148 mg/dL (ref 0–200)
HDL: 48.1 mg/dL (ref >39.00)
LDL Cholesterol: 84 mg/dL (ref 0–99)
NonHDL: 99.81
Total CHOL/HDL Ratio: 3
Triglycerides: 80 mg/dL (ref 0.0–149.0)
VLDL: 16 mg/dL (ref 0.0–40.0)

## 2021-06-27 LAB — TSH: TSH: 1.62 u[IU]/mL (ref 0.35–5.50)

## 2021-06-27 LAB — HEMOGLOBIN A1C: Hgb A1c MFr Bld: 6 % (ref 4.6–6.5)

## 2021-06-27 LAB — PSA: PSA: 0.86 ng/mL (ref 0.10–4.00)

## 2021-06-27 MED ORDER — ATORVASTATIN CALCIUM 10 MG PO TABS: 10.0000 mg | ORAL_TABLET | Freq: Every day | ORAL | 3 refills | Status: DC

## 2021-06-27 MED ORDER — LISINOPRIL 40 MG PO TABS: 40.0000 mg | ORAL_TABLET | Freq: Every day | ORAL | 3 refills | Status: DC

## 2021-06-27 MED ORDER — PANTOPRAZOLE SODIUM 40 MG PO TBEC: 40.0000 mg | DELAYED_RELEASE_TABLET | Freq: Every day | ORAL | 3 refills | Status: DC

## 2021-06-27 MED ORDER — HYDROCHLOROTHIAZIDE 25 MG PO TABS: 25.0000 mg | ORAL_TABLET | Freq: Every day | ORAL | 3 refills | Status: DC

## 2021-06-27 NOTE — Progress Notes (Signed)
Subjective:    Patient ID: Matthew Meyer., male    DOB: 14-Aug-1955, 65 y.o.   MRN: [id number]  HPI Patient presents for yearly preventative medicine examination. He is a pleasant 65 year old male who  has a past medical history of Allergy, Cancer ([place]), GERD (gastroesophageal reflux disease), History of radiation therapy (12/15/17- 01/29/18), Hyperlipidemia, and Hypertension.  Hypertension-is maintained on lisinopril 40 mg and HCTZ 25 mg daily.  He denies dizziness, lightheadedness, chest pain, or shortness of breath.  He does monitor his blood pressure at home periodically reports readings in the 120s over 80s. BP Readings from Last 3 Encounters:  06/27/21 110/68  01/01/21 125/68  10/06/20 136/80    GERD-controlled well with Protonix 40 mg daily  Hyperlipidemia - takes Lipitor 10 mg. He denies maylagia or fatigue   Lab Results  Component Value Date   CHOL 135 06/21/2020   HDL 55 06/21/2020   LDLCALC 63 06/21/2020   TRIG 87 06/21/2020   CHOLHDL 2.5 06/21/2020    All immunizations and health maintenance protocols were reviewed with the patient and needed orders were placed.  Appropriate screening laboratory values were ordered for the patient including screening of hyperlipidemia, renal function and hepatic function. If indicated by BPH, a PSA was ordered.  Medication reconciliation,  past medical history, social history, problem list and allergies were reviewed in detail with the patient  Goals were established with regard to weight loss, exercise, and  diet in compliance with medications. He has not been exercising much and has been eating more junk food then he has in the past.   He has no acute complaints today   Review of Systems  Constitutional: Negative.   HENT: Negative.    Eyes: Negative.   Respiratory: Negative.    Cardiovascular: Negative.   Gastrointestinal: Negative.   Endocrine: Negative.   Genitourinary: Negative.   Musculoskeletal: Negative.    Skin: Negative.   Allergic/Immunologic: Negative.   Neurological: Negative.   Hematological: Negative.   Psychiatric/Behavioral: Negative.    All other systems reviewed and are negative. Past Medical History:  Diagnosis Date   Allergy    pollen   Cancer [facility])    Parotid cancer-Left   GERD (gastroesophageal reflux disease)    History of radiation therapy 12/15/17- 01/29/18   Left Parotid and Left Neck 66 Gy total for 33 fractions with lower doses to at risk surrounding tissues.    Hyperlipidemia    Hypertension     Social History   Socioeconomic History   Marital status: Single    Spouse name: Not on file   Number of children: Not on file   Years of education: Not on file   Highest education level: Not on file  Occupational History   Not on file  Tobacco Use   Smoking status: Never   Smokeless tobacco: Never  Vaping Use   Vaping Use: Never used  Substance and Sexual Activity   Alcohol use: Yes    Comment: occasionally   Drug use: No   Sexual activity: Not Currently  Other Topics Concern   Not on file  Social History Narrative   Not on file   Social Determinants of Health   Financial Resource Strain: Not on file  Food Insecurity: Not on file  Transportation Needs: Not on file  Physical Activity: Not on file  Stress: Not on file  Social Connections: Not on file  Intimate Partner Violence: Not on file    Past Surgical History:  Procedure Laterality Date   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     COLONOSCOPY  01/01/2021   LEG SURGERY Left    surgery for a broken leg when he was 65 years old    MULTIPLE EXTRACTIONS WITH ALVEOLOPLASTY N/A 11/20/2017   Procedure: Extaction of tooth #'s 14 and 15 with alveoloplasty and gross debridement of remaining teeth;  Surgeon: Lenn Cal, DDS;  Location: [facility];  Service: Oral Surgery;  Laterality: N/A;   PAROTIDECTOMY Left 11/06/2017   Procedure: PAROTIDECTOMY;  Surgeon: [name], MD;  Location: [facility]  OR;  Service: ENT;  Laterality: Left;  Possible skin graft, possible nerve graft Needs RNFA   RADICAL NECK DISSECTION Left 11/06/2017   Procedure: RADICAL NECK DISSECTION;  Surgeon: [name], MD;  Location: [place];  Service: ENT;  Laterality: Left;   SKIN SPLIT GRAFT Left 11/06/2017   Procedure: SKIN GRAFT SPLIT THICKNESS;  Surgeon: [name], MD;  Location: [facility] OR;  Service: ENT;  Laterality: Left;   WISDOM TOOTH EXTRACTION      Family History  Problem Relation Age of Onset   Arthritis Mother    Hypertension Mother    Diabetes Father    Diabetes Brother    Colon cancer Neg Hx    Colon polyps Neg Hx    Esophageal cancer Neg Hx    Rectal cancer Neg Hx    Stomach cancer Neg Hx     Allergies  Allergen Reactions   Bee Pollen     Caused sinus infection   Pollen Extract     Caused sinus infection    Current Outpatient Medications on File Prior to Visit  Medication Sig Dispense Refill   acetaminophen (TYLENOL) 325 MG tablet Take 650 mg by mouth every 6 (six) hours as needed.     atorvastatin (LIPITOR) 10 MG tablet TAKE ONE TABLET BY MOUTH DAILY AT BEDTIME 90 tablet 0   cholecalciferol (VITAMIN D) 1000 units tablet Take 1,000 Units by mouth daily.     hydrochlorothiazide (HYDRODIURIL) 25 MG tablet Take 1 tablet (25 mg total) by mouth daily. 90 tablet 3   lisinopril (ZESTRIL) 40 MG tablet Take 1 tablet (40 mg total) by mouth daily. 90 tablet 3   Magnesium 400 MG TABS Take by mouth.     Multiple Vitamins-Minerals (MULTIVITAMIN WITH MINERALS) tablet Take 1 tablet by mouth daily.     Nutritional Supplements (CHOLESTEROL DEFENSE PO) Take by mouth.     pantoprazole (PROTONIX) 40 MG tablet Take 1 tablet (40 mg total) by mouth at bedtime. 90 tablet 3   vitamin E 400 UNIT capsule Take 400 Units by mouth daily.     No current facility-administered medications on file prior to visit.    BP 110/68    Pulse 70    Temp 98.9 F (37.2 C) (Oral)    Ht 5' 7.25" (1.708 m)    Wt 221 lb (100.2  kg)    SpO2 99%    BMI 34.36 kg/m       Objective:   Physical Exam Vitals and nursing note reviewed.  Constitutional:      General: He is not in acute distress.    Appearance: Normal appearance. He is well-developed. He is obese.  HENT:     Head: Normocephalic and atraumatic.     Right Ear: Tympanic membrane, ear canal and external ear normal. There is no impacted cerumen.     Left Ear: Tympanic membrane, ear  canal and external ear normal. There is no impacted cerumen.     Nose: Nose normal. No congestion or rhinorrhea.     Mouth/Throat:     Mouth: Mucous membranes are moist.     Pharynx: Oropharynx is clear. No oropharyngeal exudate or posterior oropharyngeal erythema.  Eyes:     General:        Right eye: No discharge.        Left eye: No discharge.     Extraocular Movements: Extraocular movements intact.     Conjunctiva/sclera: Conjunctivae normal.     Pupils: Pupils are equal, round, and reactive to light.  Neck:     Vascular: No carotid bruit.     Trachea: No tracheal deviation.  Cardiovascular:     Rate and Rhythm: Normal rate and regular rhythm.     Pulses: Normal pulses.     Heart sounds: Normal heart sounds. No murmur heard.   No friction rub. No gallop.  Pulmonary:     Effort: Pulmonary effort is normal. No respiratory distress.     Breath sounds: Normal breath sounds. No stridor. No wheezing, rhonchi or rales.  Chest:     Chest wall: No tenderness.  Abdominal:     General: Bowel sounds are normal. There is no distension.     Palpations: Abdomen is soft. There is no mass.     Tenderness: There is no abdominal tenderness. There is no right CVA tenderness, left CVA tenderness, guarding or rebound.     Hernia: No hernia is present.  Musculoskeletal:        General: No swelling, tenderness, deformity or signs of injury. Normal range of motion.     Right lower leg: No edema.     Left lower leg: No edema.  Lymphadenopathy:     Cervical: No cervical adenopathy.   Skin:    General: Skin is warm and dry.     Capillary Refill: Capillary refill takes less than 2 seconds.     Coloration: Skin is not jaundiced or pale.     Findings: No bruising, erythema, lesion or rash.  Neurological:     General: No focal deficit present.     Mental Status: He is alert and oriented to person, place, and time.     Cranial Nerves: No cranial nerve deficit.     Sensory: No sensory deficit.     Motor: No weakness.     Coordination: Coordination normal.     Gait: Gait normal.     Deep Tendon Reflexes: Reflexes normal.  Psychiatric:        Mood and Affect: Mood normal.        Behavior: Behavior normal.        Thought Content: Thought content normal.        Judgment: Judgment normal.      Assessment & Plan:  1. Encounter for general adult medical examination with abnormal findings - Encouraged to cut back on junk food and start exercising more often  - Follow up in one year or sooner if needed - CBC with Differential/Platelet; Future - Comprehensive metabolic panel; Future - Hemoglobin A1c; Future - Lipid panel; Future - TSH; Future  2. Essential hypertension - Controlled.  - hydrochlorothiazide (HYDRODIURIL) 25 MG tablet; Take 1 tablet (25 mg total) by mouth daily.  Dispense: 90 tablet; Refill: 3 - lisinopril (ZESTRIL) 40 MG tablet; Take 1 tablet (40 mg total) by mouth daily.  Dispense: 90 tablet; Refill: 3 - CBC with Differential/Platelet; Future -  Comprehensive metabolic panel; Future - Hemoglobin A1c; Future - Lipid panel; Future - TSH; Future  3. Gastroesophageal reflux disease with esophagitis without hemorrhage - Controlled - pantoprazole (PROTONIX) 40 MG tablet; Take 1 tablet (40 mg total) by mouth at bedtime.  Dispense: 90 tablet; Refill: 3  4. Prostate cancer screening  - PSA; Future  5. Mixed hyperlipidemia - Controlled.  - atorvastatin (LIPITOR) 10 MG tablet; Take 1 tablet (10 mg total) by mouth at bedtime.  Dispense: 90 tablet; Refill:  3 - CBC with Differential/Platelet; Future - Comprehensive metabolic panel; Future - Hemoglobin A1c; Future - Lipid panel; Future - TSH; Future  [name], NP

## 2021-06-27 NOTE — Patient Instructions (Signed)
It was great seeing you today   We will follow up with you regarding your lab work   Please let me know if you need anything   

## 2021-08-15 ENCOUNTER — Telehealth (INDEPENDENT_AMBULATORY_CARE_PROVIDER_SITE_OTHER): Payer: BC Managed Care – PPO | Admitting: Adult Health

## 2021-08-15 ENCOUNTER — Telehealth: Payer: Self-pay | Admitting: Adult Health

## 2021-08-15 ENCOUNTER — Encounter: Payer: Self-pay | Admitting: Adult Health

## 2021-08-15 VITALS — Ht 67.0 in | Wt 220.0 lb

## 2021-08-15 DIAGNOSIS — J0140 Acute pansinusitis, unspecified: Secondary | ICD-10-CM | POA: Diagnosis not present

## 2021-08-15 MED ORDER — DOXYCYCLINE HYCLATE 100 MG PO CAPS
100.0000 mg | ORAL_CAPSULE | Freq: Two times a day (BID) | ORAL | 0 refills | Status: DC
Start: 2021-08-15 — End: 2022-04-19

## 2021-08-15 NOTE — Progress Notes (Signed)
Virtual Visit via Telephone Note  I connected with Matthew Meyer. on 08/15/21 at 10:30 AM EST by telephone and verified that I am speaking with the correct person using two identifiers.   I discussed the limitations, risks, security and privacy concerns of performing an evaluation and management service by telephone and the availability of in person appointments. I also discussed with the patient that there may be a patient responsible charge related to this service. The patient expressed understanding and agreed to proceed.  Location patient: home Location provider: work or home office Participants present for the call: patient, provider Patient did not have a visit in the prior 7 days to address this/these issue(s).   History of Present Illness: He is being evaluated today for an acute issue.  His symptoms have been present for roughly 1 week and seem to be getting worse as time goes on.  Symptoms started with a sore throat that has since resolved.  Currently with similar productive cough, sinus pain and pressure, nasal congestion, discolored nasal drainage with bloody streaks, and fatigue.  He denies shortness of breath, wheezing, fevers, or chills.  At home he has been using Benadryl, Coricidin, NyQuil, and Robitussin throughout the last week without relief.   Observations/Objective: Patient sounds cheerful and well on the phone. I do not appreciate any SOB. Speech and thought processing are grossly intact. Patient reported vitals:  Assessment and Plan: 1. Acute non-recurrent pansinusitis -We will treat due to symptoms and duration.  Advise follow-up if no improvement in the next 2 to 3 days. - doxycycline (VIBRAMYCIN) 100 MG capsule; Take 1 capsule (100 mg total) by mouth 2 (two) times daily.  Dispense: 14 capsule; Refill: 0    Follow Up Instructions:  DIAGMED@   99441 5-10 [id number] 11-20 9443 21-30 I did not refer this patient for an OV in the next 24 hours for  this/these issue(s).  I discussed the assessment and treatment plan with the patient. The patient was provided an opportunity to ask questions and all were answered. The patient agreed with the plan and demonstrated an understanding of the instructions.   The patient was advised to call back or seek an in-person evaluation if the symptoms worsen or if the condition fails to improve as anticipated.  I provided 12 minutes of non-face-to-face time during this encounter.   [name], NP

## 2021-08-15 NOTE — Telephone Encounter (Signed)
Patient calling in with respiratory symptoms: Shortness of breath, chest pain, palpitations or other red words send to Triage  Does the patient have a fever over 100, cough, congestion, sore throat, runny nose, lost of taste/smell (please list symptoms that patient has)? Itchy throat and cough  What date did symptoms start?08-10-2021 (If over 5 days ago, pt may be scheduled for in person visit)  Have you tested for Covid in the last 5 days? No   If yes, was it positive []  OR negative [] ? If positive in the last 5 days, please schedule virtual visit now. If negative, schedule for an in person OV with the next available provider if PCP has no openings. Please also let patient know they will be tested again (follow the script below)  "you will have to arrive 50mins prior to your appt time to be Covid tested. Please park in back of office at the [facility] & call [phone] to let the staff know you have arrived. A staff member will meet you at your car to do a rapid covid test. Once the test has resulted you will be notified by phone of your results to determine if appt will remain an in person visit or be converted to a virtual/phone visit. If you arrive less than 31mins before your appt time, your visit will be automatically converted to virtual & any recommended testing will happen AFTER the visit." Pt has virtual with cory 08-15-2021 1030 am  THINGS TO REMEMBER  If no availability for virtual visit in office,  please schedule another  office  If no availability at another [place] office, please instruct patient that they can schedule an evisit or virtual visit through their mychart account. Visits up to 8pm  patients can be seen in office 5 days after positive COVID test

## 2021-11-27 DIAGNOSIS — H524 Presbyopia: Secondary | ICD-10-CM | POA: Diagnosis not present

## 2021-11-27 DIAGNOSIS — H43813 Vitreous degeneration, bilateral: Secondary | ICD-10-CM | POA: Diagnosis not present

## 2021-11-27 DIAGNOSIS — H2513 Age-related nuclear cataract, bilateral: Secondary | ICD-10-CM | POA: Diagnosis not present

## 2021-11-27 DIAGNOSIS — H40013 Open angle with borderline findings, low risk, bilateral: Secondary | ICD-10-CM | POA: Diagnosis not present

## 2022-04-19 ENCOUNTER — Ambulatory Visit (INDEPENDENT_AMBULATORY_CARE_PROVIDER_SITE_OTHER): Payer: BC Managed Care – PPO | Admitting: Adult Health

## 2022-04-19 ENCOUNTER — Encounter: Payer: Self-pay | Admitting: Adult Health

## 2022-04-19 VITALS — BP 110/70 | HR 74 | Temp 98.1°F | Ht 67.0 in | Wt 220.0 lb

## 2022-04-19 DIAGNOSIS — H8110 Benign paroxysmal vertigo, unspecified ear: Secondary | ICD-10-CM | POA: Diagnosis not present

## 2022-04-19 MED ORDER — MECLIZINE HCL 25 MG PO TABS: 25.0000 mg | ORAL_TABLET | Freq: Three times a day (TID) | ORAL | 0 refills | Status: AC | PRN

## 2022-04-19 NOTE — Progress Notes (Signed)
Subjective:    Patient ID: Matthew Heads., male    DOB: 1956/02/16, 66 y.o.   MRN: [id number]  HPI 66 year old male who  has a past medical history of Allergy, Cancer ([place]), GERD (gastroesophageal reflux disease), History of radiation therapy (12/15/17- 01/29/18), Hyperlipidemia, and Hypertension.  He presents to the office today for vertigo. He reports that he woke up yesterday and he became dizzy and felt like the world was spinning around him. When he laid back down the symptoms came back. A few minutes later he tried to get out of bed again and the dizziness  came back. His mom gave him a dose of meclizine and his symptoms resolved.   He has not had any symptoms today. He wants to make sure he does not have an ear infection    Review of Systems See HPI   Past Medical History:  Diagnosis Date   Allergy    pollen   Cancer [facility])    Parotid cancer-Left   GERD (gastroesophageal reflux disease)    History of radiation therapy 12/15/17- 01/29/18   Left Parotid and Left Neck 66 Gy total for 33 fractions with lower doses to at risk surrounding tissues.    Hyperlipidemia    Hypertension     Social History   Socioeconomic History   Marital status: Single    Spouse name: Not on file   Number of children: Not on file   Years of education: Not on file   Highest education level: Not on file  Occupational History   Not on file  Tobacco Use   Smoking status: Never   Smokeless tobacco: Never  Vaping Use   Vaping Use: Never used  Substance and Sexual Activity   Alcohol use: Yes    Comment: occasionally   Drug use: No   Sexual activity: Not Currently  Other Topics Concern   Not on file  Social History Narrative   Not on file   Social Determinants of Health   Financial Resource Strain: Not on file  Food Insecurity: Not on file  Transportation Needs: No Transportation Needs (05/08/2018)   PRAPARE - Hydrologist (Medical): No    Lack of  Transportation (Non-Medical): No  Physical Activity: Not on file  Stress: Not on file  Social Connections: Not on file  Intimate Partner Violence: Not At Risk (02/20/2018)   Humiliation, Afraid, Rape, and Kick questionnaire    Fear of Current or Ex-Partner: No    Emotionally Abused: No    Physically Abused: No    Sexually Abused: No    Past Surgical History:  Procedure Laterality Date   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     COLONOSCOPY  01/01/2021   LEG SURGERY Left    surgery for a broken leg when he was 66 years old    MULTIPLE EXTRACTIONS WITH ALVEOLOPLASTY N/A 11/20/2017   Procedure: Extaction of tooth #'s 14 and 15 with alveoloplasty and gross debridement of remaining teeth;  Surgeon: Lenn Cal, DDS;  Location: [facility];  Service: Oral Surgery;  Laterality: N/A;   PAROTIDECTOMY Left 11/06/2017   Procedure: PAROTIDECTOMY;  Surgeon: [name], MD;  Location: [facility] OR;  Service: ENT;  Laterality: Left;  Possible skin graft, possible nerve graft Needs RNFA   RADICAL NECK DISSECTION Left 11/06/2017   Procedure: RADICAL NECK DISSECTION;  Surgeon: [name], MD;  Location: [place];  Service: ENT;  Laterality: Left;   SKIN SPLIT GRAFT Left 11/06/2017   Procedure: SKIN GRAFT SPLIT THICKNESS;  Surgeon: [name], MD;  Location[facility] OR;  Service: ENT;  Laterality: Left;   WISDOM TOOTH EXTRACTION      Family History  Problem Relation Age of Onset   Arthritis Mother    Hypertension Mother    Diabetes Father    Diabetes Brother    Colon cancer Neg Hx    Colon polyps Neg Hx    Esophageal cancer Neg Hx    Rectal cancer Neg Hx    Stomach cancer Neg Hx     Allergies  Allergen Reactions   Bee Pollen     Caused sinus infection   Pollen Extract     Caused sinus infection    Current Outpatient Medications on File Prior to Visit  Medication Sig Dispense Refill   acetaminophen (TYLENOL) 325 MG tablet Take 650 mg by mouth every 6 (six) hours as needed.      atorvastatin (LIPITOR) 10 MG tablet Take 1 tablet (10 mg total) by mouth at bedtime. 90 tablet 3   cholecalciferol (VITAMIN D) 1000 units tablet Take 1,000 Units by mouth daily.     hydrochlorothiazide (HYDRODIURIL) 25 MG tablet Take 1 tablet (25 mg total) by mouth daily. 90 tablet 3   lisinopril (ZESTRIL) 40 MG tablet Take 1 tablet (40 mg total) by mouth daily. 90 tablet 3   Magnesium 400 MG TABS Take by mouth.     Multiple Vitamins-Minerals (MULTIVITAMIN WITH MINERALS) tablet Take 1 tablet by mouth daily.     Nutritional Supplements (CHOLESTEROL DEFENSE PO) Take by mouth.     pantoprazole (PROTONIX) 40 MG tablet Take 1 tablet (40 mg total) by mouth at bedtime. 90 tablet 3   vitamin E 400 UNIT capsule Take 400 Units by mouth daily.     No current facility-administered medications on file prior to visit.    BP 110/70   Pulse 74   Temp 98.1 F (36.7 C) (Oral)   Ht '5\' 7"'$  (1.702 m)   Wt 220 lb (99.8 kg)   SpO2 98%   BMI 34.46 kg/m       Objective:   Physical Exam Vitals and nursing note reviewed.  Constitutional:      Appearance: Normal appearance.  HENT:     Right Ear: Tympanic membrane, ear canal and external ear normal. There is no impacted cerumen.     Left Ear: Tympanic membrane, ear canal and external ear normal. There is no impacted cerumen.  Eyes:     Extraocular Movements: Extraocular movements intact.     Conjunctiva/sclera: Conjunctivae normal.     Pupils: Pupils are equal, round, and reactive to light.  Cardiovascular:     Rate and Rhythm: Normal rate and regular rhythm.     Pulses: Normal pulses.     Heart sounds: Normal heart sounds.  Pulmonary:     Effort: Pulmonary effort is normal.     Breath sounds: Normal breath sounds.  Musculoskeletal:        General: Normal range of motion.  Skin:    General: Skin is warm and dry.  Neurological:     General: No focal deficit present.     Mental Status: He is alert and oriented to person, place, and time.   Psychiatric:        Mood and Affect: Mood normal.        Behavior: Behavior normal.  Thought Content: Thought content normal.       Assessment & Plan:  1. Benign paroxysmal positional vertigo, unspecified laterality - Explained to him what vertigo was. His symptoms have resolved. He would like to have Antivert on hand incase it comes back again  - meclizine (ANTIVERT) 25 MG tablet; Take 1 tablet (25 mg total) by mouth 3 (three) times daily as needed for dizziness.  Dispense: 30 tablet; Refill: 0  [name], NP

## 2022-04-22 DIAGNOSIS — Z23 Encounter for immunization: Secondary | ICD-10-CM | POA: Diagnosis not present

## 2022-06-28 ENCOUNTER — Encounter: Payer: BC Managed Care – PPO | Admitting: Adult Health

## 2022-07-04 ENCOUNTER — Encounter: Payer: Self-pay | Admitting: Adult Health

## 2022-07-04 ENCOUNTER — Ambulatory Visit (INDEPENDENT_AMBULATORY_CARE_PROVIDER_SITE_OTHER): Payer: BC Managed Care – PPO | Admitting: Adult Health

## 2022-07-04 VITALS — BP 120/80 | HR 75 | Temp 98.2°F | Ht 66.75 in | Wt 219.0 lb

## 2022-07-04 DIAGNOSIS — R7303 Prediabetes: Secondary | ICD-10-CM | POA: Diagnosis not present

## 2022-07-04 DIAGNOSIS — I10 Essential (primary) hypertension: Secondary | ICD-10-CM

## 2022-07-04 DIAGNOSIS — Z0001 Encounter for general adult medical examination with abnormal findings: Secondary | ICD-10-CM

## 2022-07-04 DIAGNOSIS — Z125 Encounter for screening for malignant neoplasm of prostate: Secondary | ICD-10-CM | POA: Diagnosis not present

## 2022-07-04 DIAGNOSIS — E782 Mixed hyperlipidemia: Secondary | ICD-10-CM

## 2022-07-04 DIAGNOSIS — Z23 Encounter for immunization: Secondary | ICD-10-CM | POA: Diagnosis not present

## 2022-07-04 DIAGNOSIS — K2100 Gastro-esophageal reflux disease with esophagitis, without bleeding: Secondary | ICD-10-CM

## 2022-07-04 LAB — CBC WITH DIFFERENTIAL/PLATELET
Basophils Absolute: 0.1 10*3/uL (ref 0.0–0.1)
Basophils Relative: 0.9 % (ref 0.0–3.0)
Eosinophils Absolute: 0.2 10*3/uL (ref 0.0–0.7)
Eosinophils Relative: 2.5 % (ref 0.0–5.0)
HCT: 40.7 % (ref 39.0–52.0)
Hemoglobin: 13.6 g/dL (ref 13.0–17.0)
Lymphocytes Relative: 17.5 % (ref 12.0–46.0)
Lymphs Abs: 1.1 10*3/uL (ref 0.7–4.0)
MCHC: 33.4 g/dL (ref 30.0–36.0)
MCV: 86.2 fl (ref 78.0–100.0)
Monocytes Absolute: 0.3 10*3/uL (ref 0.1–1.0)
Monocytes Relative: 5.1 % (ref 3.0–12.0)
Neutro Abs: 4.5 10*3/uL (ref 1.4–7.7)
Neutrophils Relative %: 74 % (ref 43.0–77.0)
Platelets: 218 10*3/uL (ref 150.0–400.0)
RBC: 4.72 Mil/uL (ref 4.22–5.81)
RDW: 14.1 % (ref 11.5–15.5)
WBC: 6.1 10*3/uL (ref 4.0–10.5)

## 2022-07-04 LAB — COMPREHENSIVE METABOLIC PANEL
ALT: 25 U/L (ref 0–53)
AST: 27 U/L (ref 0–37)
Albumin: 4.6 g/dL (ref 3.5–5.2)
Alkaline Phosphatase: 45 U/L (ref 39–117)
BUN: 15 mg/dL (ref 6–23)
CO2: 31 mEq/L (ref 19–32)
Calcium: 9.8 mg/dL (ref 8.4–10.5)
Chloride: 101 mEq/L (ref 96–112)
Creatinine, Ser: 1.2 mg/dL (ref 0.40–1.50)
GFR: 63.62 mL/min (ref >60.00)
Glucose, Bld: 105 mg/dL — ABNORMAL HIGH (ref 70–99)
Potassium: 3.9 mEq/L (ref 3.5–5.1)
Sodium: 140 mEq/L (ref 135–145)
Total Bilirubin: 0.8 mg/dL (ref 0.2–1.2)
Total Protein: 7.5 g/dL (ref 6.0–8.3)

## 2022-07-04 LAB — LIPID PANEL
Cholesterol: 140 mg/dL (ref 0–200)
HDL: 50.8 mg/dL (ref >39.00)
LDL Cholesterol: 68 mg/dL (ref 0–99)
NonHDL: 89.48
Total CHOL/HDL Ratio: 3
Triglycerides: 106 mg/dL (ref 0.0–149.0)
VLDL: 21.2 mg/dL (ref 0.0–40.0)

## 2022-07-04 LAB — HEMOGLOBIN A1C: Hgb A1c MFr Bld: 5.8 % (ref 4.6–6.5)

## 2022-07-04 MED ORDER — LISINOPRIL 40 MG PO TABS: 40.0000 mg | ORAL_TABLET | Freq: Every day | ORAL | 3 refills | Status: DC

## 2022-07-04 MED ORDER — HYDROCHLOROTHIAZIDE 25 MG PO TABS: 25.0000 mg | ORAL_TABLET | Freq: Every day | ORAL | 3 refills | Status: DC

## 2022-07-04 MED ORDER — ATORVASTATIN CALCIUM 10 MG PO TABS: 10.0000 mg | ORAL_TABLET | Freq: Every day | ORAL | 3 refills | Status: DC

## 2022-07-04 MED ORDER — PANTOPRAZOLE SODIUM 40 MG PO TBEC: 40.0000 mg | DELAYED_RELEASE_TABLET | Freq: Every day | ORAL | 3 refills | Status: DC

## 2022-07-04 NOTE — Addendum Note (Signed)
Addended by: Gwenyth Ober R on: 07/04/2022 02:42 PM   Modules accepted: Orders

## 2022-07-04 NOTE — Patient Instructions (Signed)
It was great seeing you today   We will follow up with you regarding your lab work   Please let me know if you need anything   Follow up in 3 months for your second shingles vaccination.

## 2022-07-04 NOTE — Progress Notes (Signed)
Subjective:    Patient ID: Matthew Heads., male    DOB: 10-Jan-1956, 66 y.o.   MRN: [id number]  HPI Patient presents for yearly preventative medicine examination. He is a pleasant 66 year old male who  has a past medical history of Allergy, Cancer ([place]), GERD (gastroesophageal reflux disease), History of radiation therapy (12/15/17- 01/29/18), Hyperlipidemia, and Hypertension.  Hypertension -maintained on lisinopril 40 mg daily and HCTZ 25 mg daily.  He denies dizziness, lightheadedness, chest pain, shortness of breath.  He does monitor his blood pressure at home periodically reports readings in the 120s over 80s BP Readings from Last 3 Encounters:  07/04/22 120/80  04/19/22 110/70  06/27/21 110/68   GERD-controlled with Protonix 40 mg daily  Hyperlipidemia-managed with Lipitor 10 mg daily.  He denies myalgia or fatigue Lab Results  Component Value Date   CHOL 148 06/27/2021   HDL 48.10 06/27/2021   LDLCALC 84 06/27/2021   TRIG 80.0 06/27/2021   CHOLHDL 3 06/27/2021   Pre Diabetes - not currently on medication Lab Results  Component Value Date   HGBA1C 6.0 06/27/2021    All immunizations and health maintenance protocols were reviewed with the patient and needed orders were placed.  Appropriate screening laboratory values were ordered for the patient including screening of hyperlipidemia, renal function and hepatic function. If indicated by BPH, a PSA was ordered.  Medication reconciliation,  past medical history, social history, problem list and allergies were reviewed in detail with the patient  Goals were established with regard to weight loss, exercise, and  diet in compliance with medications Wt Readings from Last 3 Encounters:  07/04/22 219 lb (99.3 kg)  04/19/22 220 lb (99.8 kg)  08/15/21 220 lb (99.8 kg)    Review of Systems  Constitutional: Negative.   HENT: Negative.    Eyes: Negative.   Respiratory: Negative.    Cardiovascular: Negative.    Gastrointestinal: Negative.   Endocrine: Negative.   Genitourinary: Negative.   Musculoskeletal: Negative.   Skin: Negative.   Allergic/Immunologic: Negative.   Neurological: Negative.   Hematological: Negative.   Psychiatric/Behavioral: Negative.    All other systems reviewed and are negative.  Past Medical History:  Diagnosis Date   Allergy    pollen   Cancer [facility])    Parotid cancer-Left   GERD (gastroesophageal reflux disease)    History of radiation therapy 12/15/17- 01/29/18   Left Parotid and Left Neck 66 Gy total for 33 fractions with lower doses to at risk surrounding tissues.    Hyperlipidemia    Hypertension     Social History   Socioeconomic History   Marital status: Single    Spouse name: Not on file   Number of children: Not on file   Years of education: Not on file   Highest education level: Not on file  Occupational History   Not on file  Tobacco Use   Smoking status: Never   Smokeless tobacco: Never  Vaping Use   Vaping Use: Never used  Substance and Sexual Activity   Alcohol use: Yes    Comment: occasionally   Drug use: No   Sexual activity: Not Currently  Other Topics Concern   Not on file  Social History Narrative   Not on file   Social Determinants of Health   Financial Resource Strain: Not on file  Food Insecurity: Not on file  Transportation Needs: No Transportation Needs (05/08/2018)   PRAPARE - Hydrologist (Medical): No  Lack of Transportation (Non-Medical): No  Physical Activity: Not on file  Stress: Not on file  Social Connections: Not on file  Intimate Partner Violence: Not At Risk (02/20/2018)   Humiliation, Afraid, Rape, and Kick questionnaire    Fear of Current or Ex-Partner: No    Emotionally Abused: No    Physically Abused: No    Sexually Abused: No    Past Surgical History:  Procedure Laterality Date   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     COLONOSCOPY  01/01/2021   LEG  SURGERY Left    surgery for a broken leg when he was 66 years old    MULTIPLE EXTRACTIONS WITH ALVEOLOPLASTY N/A 11/20/2017   Procedure: Extaction of tooth #'s 14 and 15 with alveoloplasty and gross debridement of remaining teeth;  Surgeon: Lenn Cal, DDS;  Location: [facility];  Service: Oral Surgery;  Laterality: N/A;   PAROTIDECTOMY Left 11/06/2017   Procedure: PAROTIDECTOMY;  Surgeon: [name], MD;  Location: [facility] OR;  Service: ENT;  Laterality: Left;  Possible skin graft, possible nerve graft Needs RNFA   RADICAL NECK DISSECTION Left 11/06/2017   Procedure: RADICAL NECK DISSECTION;  Surgeon: [name], MD;  Location: [place];  Service: ENT;  Laterality: Left;   SKIN SPLIT GRAFT Left 11/06/2017   Procedure: SKIN GRAFT SPLIT THICKNESS;  Surgeon: [name], MD;  [facility];  Service: ENT;  Laterality: Left;   WISDOM TOOTH EXTRACTION      Family History  Problem Relation Age of Onset   Arthritis Mother    Hypertension Mother    Diabetes Father    Diabetes Brother    Colon cancer Neg Hx    Colon polyps Neg Hx    Esophageal cancer Neg Hx    Rectal cancer Neg Hx    Stomach cancer Neg Hx     Allergies  Allergen Reactions   Bee Pollen     Caused sinus infection   Pollen Extract     Caused sinus infection    Current Outpatient Medications on File Prior to Visit  Medication Sig Dispense Refill   acetaminophen (TYLENOL) 325 MG tablet Take 650 mg by mouth every 6 (six) hours as needed.     atorvastatin (LIPITOR) 10 MG tablet Take 1 tablet (10 mg total) by mouth at bedtime. 90 tablet 3   cholecalciferol (VITAMIN D) 1000 units tablet Take 1,000 Units by mouth daily.     hydrochlorothiazide (HYDRODIURIL) 25 MG tablet Take 1 tablet (25 mg total) by mouth daily. 90 tablet 3   lisinopril (ZESTRIL) 40 MG tablet Take 1 tablet (40 mg total) by mouth daily. 90 tablet 3   Magnesium 400 MG TABS Take by mouth.     meclizine (ANTIVERT) 25 MG tablet Take 1 tablet (25 mg total) by  mouth 3 (three) times daily as needed for dizziness. 30 tablet 0   Multiple Vitamins-Minerals (MULTIVITAMIN WITH MINERALS) tablet Take 1 tablet by mouth daily.     Nutritional Supplements (CHOLESTEROL DEFENSE PO) Take by mouth.     pantoprazole (PROTONIX) 40 MG tablet Take 1 tablet (40 mg total) by mouth at bedtime. 90 tablet 3   vitamin E 400 UNIT capsule Take 400 Units by mouth daily.     No current facility-administered medications on file prior to visit.    BP 120/80   Pulse 75   Temp 98.2 F (36.8 C) (Oral)   Ht 5' 6.75" (1.695 m)  Wt 219 lb (99.3 kg)   SpO2 98%   BMI 34.56 kg/m       Objective:   Physical Exam Vitals and nursing note reviewed.  Constitutional:      General: He is not in acute distress.    Appearance: Normal appearance. He is well-developed. He is obese.  HENT:     Head: Normocephalic and atraumatic.     Right Ear: Tympanic membrane, ear canal and external ear normal. There is no impacted cerumen.     Left Ear: Tympanic membrane, ear canal and external ear normal. There is no impacted cerumen.     Nose: Nose normal. No congestion or rhinorrhea.     Mouth/Throat:     Mouth: Mucous membranes are moist.     Pharynx: Oropharynx is clear. No oropharyngeal exudate or posterior oropharyngeal erythema.  Eyes:     General:        Right eye: No discharge.        Left eye: No discharge.     Extraocular Movements: Extraocular movements intact.     Conjunctiva/sclera: Conjunctivae normal.     Pupils: Pupils are equal, round, and reactive to light.  Neck:     Vascular: No carotid bruit.     Trachea: No tracheal deviation.  Cardiovascular:     Rate and Rhythm: Normal rate and regular rhythm.     Pulses: Normal pulses.     Heart sounds: Normal heart sounds. No murmur heard.    No friction rub. No gallop.  Pulmonary:     Effort: Pulmonary effort is normal. No respiratory distress.     Breath sounds: Normal breath sounds. No stridor. No wheezing, rhonchi or  rales.  Chest:     Chest wall: No tenderness.  Abdominal:     General: Bowel sounds are normal. There is no distension.     Palpations: Abdomen is soft. There is no mass.     Tenderness: There is no abdominal tenderness. There is no right CVA tenderness, left CVA tenderness, guarding or rebound.     Hernia: No hernia is present.  Musculoskeletal:        General: No swelling, tenderness, deformity or signs of injury. Normal range of motion.     Right lower leg: No edema.     Left lower leg: No edema.  Lymphadenopathy:     Cervical: No cervical adenopathy.  Skin:    General: Skin is warm and dry.     Capillary Refill: Capillary refill takes less than 2 seconds.     Coloration: Skin is not jaundiced or pale.     Findings: No bruising, erythema, lesion or rash.  Neurological:     General: No focal deficit present.     Mental Status: He is alert and oriented to person, place, and time.     Cranial Nerves: No cranial nerve deficit.     Sensory: No sensory deficit.     Motor: No weakness.     Coordination: Coordination normal.     Gait: Gait normal.     Deep Tendon Reflexes: Reflexes normal.  Psychiatric:        Mood and Affect: Mood normal.        Behavior: Behavior normal.        Thought Content: Thought content normal.        Judgment: Judgment normal.       Assessment & Plan:  1. Encounter for general adult medical examination with abnormal findings Today patient counseled  on age appropriate routine health concerns for screening and prevention, each reviewed and up to date or declined. Immunizations reviewed and up to date or declined. Labs ordered and reviewed. Risk factors for depression reviewed and negative. Hearing function and visual acuity are intact. ADLs screened and addressed as needed. Functional ability and level of safety reviewed and appropriate. Education, counseling and referrals performed based on assessed risks today. Patient provided with a copy of personalized  plan for preventive services. - Prevnar 20 and 1st dose of shingles vaccination given today    2. Essential hypertension - Well controlled.  - No change in medications  - CBC with Differential/Platelet; Future - Comprehensive metabolic panel; Future - Hemoglobin A1c; Future - Lipid panel; Future - TSH; Future - hydrochlorothiazide (HYDRODIURIL) 25 MG tablet; Take 1 tablet (25 mg total) by mouth daily.  Dispense: 90 tablet; Refill: 3 - lisinopril (ZESTRIL) 40 MG tablet; Take 1 tablet (40 mg total) by mouth daily.  Dispense: 90 tablet; Refill: 3  3. Gastroesophageal reflux disease with esophagitis without hemorrhage - Continue with PPI  - CBC with Differential/Platelet; Future - Comprehensive metabolic panel; Future - Hemoglobin A1c; Future - Lipid panel; Future - TSH; Future - pantoprazole (PROTONIX) 40 MG tablet; Take 1 tablet (40 mg total) by mouth at bedtime.  Dispense: 90 tablet; Refill: 3  4. Mixed hyperlipidemia - Continue with statin  - CBC with Differential/Platelet; Future - Comprehensive metabolic panel; Future - Hemoglobin A1c; Future - Lipid panel; Future - TSH; Future - atorvastatin (LIPITOR) 10 MG tablet; Take 1 tablet (10 mg total) by mouth at bedtime.  Dispense: 90 tablet; Refill: 3  5. Prediabetes - Consider adding agent  - CBC with Differential/Platelet; Future - Comprehensive metabolic panel; Future - Hemoglobin A1c; Future - Lipid panel; Future - TSH; Future  6. Prostate cancer screening  - PSA; Future  [name], NP

## 2022-07-05 LAB — PSA: PSA: 0.82 ng/mL (ref 0.10–4.00)

## 2022-07-05 LAB — TSH: TSH: 1.56 u[IU]/mL (ref 0.35–5.50)

## 2022-07-10 ENCOUNTER — Telehealth: Payer: Self-pay | Admitting: Adult Health

## 2022-07-10 NOTE — Telephone Encounter (Signed)
Pt called, returning CMA's call. CMA was unavailable. Pt asked that CMA call back at her earliest convenience.

## 2022-07-10 NOTE — Telephone Encounter (Signed)
Patient notified of update  and verbalized understanding. 

## 2022-07-19 DIAGNOSIS — H04123 Dry eye syndrome of bilateral lacrimal glands: Secondary | ICD-10-CM | POA: Diagnosis not present

## 2022-07-19 DIAGNOSIS — D23111 Other benign neoplasm of skin of right upper eyelid, including canthus: Secondary | ICD-10-CM | POA: Diagnosis not present

## 2022-07-19 DIAGNOSIS — H40013 Open angle with borderline findings, low risk, bilateral: Secondary | ICD-10-CM | POA: Diagnosis not present

## 2022-10-03 ENCOUNTER — Ambulatory Visit (INDEPENDENT_AMBULATORY_CARE_PROVIDER_SITE_OTHER): Payer: BC Managed Care – PPO | Admitting: *Deleted

## 2022-10-03 DIAGNOSIS — Z23 Encounter for immunization: Secondary | ICD-10-CM

## 2023-01-13 DIAGNOSIS — H11441 Conjunctival cysts, right eye: Secondary | ICD-10-CM | POA: Diagnosis not present

## 2023-01-13 DIAGNOSIS — H2513 Age-related nuclear cataract, bilateral: Secondary | ICD-10-CM | POA: Diagnosis not present

## 2023-01-13 DIAGNOSIS — H25013 Cortical age-related cataract, bilateral: Secondary | ICD-10-CM | POA: Diagnosis not present

## 2023-01-13 DIAGNOSIS — H40013 Open angle with borderline findings, low risk, bilateral: Secondary | ICD-10-CM | POA: Diagnosis not present

## 2023-04-09 DIAGNOSIS — Z23 Encounter for immunization: Secondary | ICD-10-CM | POA: Diagnosis not present

## 2023-05-23 ENCOUNTER — Ambulatory Visit (INDEPENDENT_AMBULATORY_CARE_PROVIDER_SITE_OTHER): Payer: BC Managed Care – PPO | Admitting: Adult Health

## 2023-05-23 ENCOUNTER — Encounter: Payer: Self-pay | Admitting: Adult Health

## 2023-05-23 VITALS — BP 110/64 | HR 68 | Temp 98.2°F | Ht 66.75 in | Wt 220.4 lb

## 2023-05-23 DIAGNOSIS — H60502 Unspecified acute noninfective otitis externa, left ear: Secondary | ICD-10-CM | POA: Diagnosis not present

## 2023-05-23 DIAGNOSIS — H6122 Impacted cerumen, left ear: Secondary | ICD-10-CM

## 2023-05-23 MED ORDER — CIPROFLOXACIN-DEXAMETHASONE 0.3-0.1 % OT SUSP: 4.0000 [drp] | Freq: Two times a day (BID) | OTIC | 0 refills | Status: AC

## 2023-05-23 NOTE — Progress Notes (Signed)
Subjective:    Patient ID: Matthew Lou., male    DOB: February 04, 1956, 67 y.o.   MRN: [id number]  HPI  67 year old male who  has a past medical history of Allergy, Cancer ([facility]), GERD (gastroesophageal reflux disease), History of radiation therapy (12/15/17- 01/29/18), Hyperlipidemia, and Hypertension.  He presents to the office today for for concern of a ear infection in his left ear.  He reports that over the last week or so he has had some left-sided ear pain and a few days ago he had some bloody drainage from the left ear.  He has not had any fevers or chills.  Review of Systems See HPI   Past Medical History:  Diagnosis Date   Allergy    pollen   Cancer [facility])    Parotid cancer-Left   GERD (gastroesophageal reflux disease)    History of radiation therapy 12/15/17- 01/29/18   Left Parotid and Left Neck 66 Gy total for 33 fractions with lower doses to at risk surrounding tissues.    Hyperlipidemia    Hypertension     Social History   Socioeconomic History   Marital status: Single    Spouse name: Not on file   Number of children: Not on file   Years of education: Not on file   Highest education level: Not on file  Occupational History   Not on file  Tobacco Use   Smoking status: Never   Smokeless tobacco: Never  Vaping Use   Vaping status: Never Used  Substance and Sexual Activity   Alcohol use: Yes    Comment: occasionally   Drug use: No   Sexual activity: Not Currently  Other Topics Concern   Not on file  Social History Narrative   Not on file   Social Determinants of Health   Financial Resource Strain: Not on file  Food Insecurity: Not on file  Transportation Needs: No Transportation Needs (05/08/2018)   PRAPARE - Administrator, Civil Service (Medical): No    Lack of Transportation (Non-Medical): No  Physical Activity: Not on file  Stress: Not on file  Social Connections: Not on file  Intimate Partner Violence: Not At Risk (02/20/2018)    Humiliation, Afraid, Rape, and Kick questionnaire    Fear of Current or Ex-Partner: No    Emotionally Abused: No    Physically Abused: No    Sexually Abused: No    Past Surgical History:  Procedure Laterality Date   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     COLONOSCOPY  01/01/2021   LEG SURGERY Left    surgery for a broken leg when he was 67 years old    MULTIPLE EXTRACTIONS WITH ALVEOLOPLASTY N/A 11/20/2017   Procedure: Extaction of tooth #'s 14 and 15 with alveoloplasty and gross debridement of remaining teeth;  Surgeon: Charlynne Pander, DDS;  Location: [facility];  Service: Oral Surgery;  Laterality: N/A;   PAROTIDECTOMY Left 11/06/2017   Procedure: PAROTIDECTOMY;  Surgeon: Serena Colonel, MD;  Location: [facility] OR;  Service: ENT;  Laterality: Left;  Possible skin graft, possible nerve graft Needs RNFA   RADICAL NECK DISSECTION Left 11/06/2017   Procedure: RADICAL NECK DISSECTION;  Surgeon: Serena Colonel, MD;  Location[facility] OR;  Service: ENT;  Laterality: Left;   SKIN SPLIT GRAFT Left 11/06/2017   Procedure: SKIN GRAFT SPLIT THICKNESS;  Surgeon: Serena Colonel, MD;  Location: [facility] OR;  Service: ENT;  Laterality: Left;  WISDOM TOOTH EXTRACTION      Family History  Problem Relation Age of Onset   Arthritis Mother    Hypertension Mother    Diabetes Father    Diabetes Brother    Colon cancer Neg Hx    Colon polyps Neg Hx    Esophageal cancer Neg Hx    Rectal cancer Neg Hx    Stomach cancer Neg Hx     Allergies  Allergen Reactions   Bee Pollen     Caused sinus infection   Pollen Extract     Caused sinus infection    Current Outpatient Medications on File Prior to Visit  Medication Sig Dispense Refill   acetaminophen (TYLENOL) 325 MG tablet Take 650 mg by mouth every 6 (six) hours as needed.     atorvastatin (LIPITOR) 10 MG tablet Take 1 tablet (10 mg total) by mouth at bedtime. 90 tablet 3   cholecalciferol (VITAMIN D) 1000 units tablet Take 1,000 Units by mouth daily.      hydrochlorothiazide (HYDRODIURIL) 25 MG tablet Take 1 tablet (25 mg total) by mouth daily. 90 tablet 3   lisinopril (ZESTRIL) 40 MG tablet Take 1 tablet (40 mg total) by mouth daily. 90 tablet 3   Magnesium 400 MG TABS Take by mouth.     meclizine (ANTIVERT) 25 MG tablet Take 1 tablet (25 mg total) by mouth 3 (three) times daily as needed for dizziness. 30 tablet 0   Multiple Vitamins-Minerals (MULTIVITAMIN WITH MINERALS) tablet Take 1 tablet by mouth daily.     Nutritional Supplements (CHOLESTEROL DEFENSE PO) Take by mouth.     pantoprazole (PROTONIX) 40 MG tablet Take 1 tablet (40 mg total) by mouth at bedtime. 90 tablet 3   vitamin E 400 UNIT capsule Take 400 Units by mouth daily.     No current facility-administered medications on file prior to visit.    BP 110/64   Pulse 68   Temp 98.2 F (36.8 C) (Oral)   Ht 5' 6.75" (1.695 m)   Wt 220 lb 6.4 oz (100 kg)   SpO2 98%   BMI 34.78 kg/m       Objective:   Physical Exam Vitals and nursing note reviewed.  Constitutional:      Appearance: Normal appearance.  HENT:     Right Ear: Tympanic membrane, ear canal and external ear normal.     Left Ear: There is impacted cerumen.  Skin:    General: Skin is warm and dry.  Neurological:     General: No focal deficit present.     Mental Status: He is alert and oriented to person, place, and time.  Psychiatric:        Mood and Affect: Mood normal.        Behavior: Behavior normal.        Thought Content: Thought content normal.        Judgment: Judgment normal.       Assessment & Plan:  1. Impacted cerumen of left ear Verbal consent obtained.  Using a curette cerumen impaction was easily removed.  After cerumen impaction was removed ear canal was noticed to be red with some purulent drainage.  TM was intact.  He tolerated procedure well and reported improvement in pain and hearing   2. Acute otitis externa of left ear, unspecified type  - ciprofloxacin-dexamethasone  (CIPRODEX) OTIC suspension; Place 4 drops into the left ear 2 (two) times daily for 7 days.  Dispense: 7.5 mL; Refill: 0  Shirline Frees, NP

## 2023-07-08 ENCOUNTER — Encounter: Payer: BC Managed Care – PPO | Admitting: Adult Health

## 2023-07-31 ENCOUNTER — Ambulatory Visit (INDEPENDENT_AMBULATORY_CARE_PROVIDER_SITE_OTHER): Payer: BC Managed Care – PPO | Admitting: Adult Health

## 2023-07-31 ENCOUNTER — Encounter: Payer: Self-pay | Admitting: Adult Health

## 2023-07-31 VITALS — BP 110/80 | HR 67 | Temp 98.3°F | Ht 67.0 in | Wt 221.0 lb

## 2023-07-31 DIAGNOSIS — K2100 Gastro-esophageal reflux disease with esophagitis, without bleeding: Secondary | ICD-10-CM | POA: Diagnosis not present

## 2023-07-31 DIAGNOSIS — N529 Male erectile dysfunction, unspecified: Secondary | ICD-10-CM

## 2023-07-31 DIAGNOSIS — Z125 Encounter for screening for malignant neoplasm of prostate: Secondary | ICD-10-CM | POA: Diagnosis not present

## 2023-07-31 DIAGNOSIS — I10 Essential (primary) hypertension: Secondary | ICD-10-CM

## 2023-07-31 DIAGNOSIS — E782 Mixed hyperlipidemia: Secondary | ICD-10-CM

## 2023-07-31 DIAGNOSIS — Z0000 Encounter for general adult medical examination without abnormal findings: Secondary | ICD-10-CM | POA: Diagnosis not present

## 2023-07-31 DIAGNOSIS — R7303 Prediabetes: Secondary | ICD-10-CM | POA: Diagnosis not present

## 2023-07-31 LAB — CBC
HCT: 41 % (ref 39.0–52.0)
Hemoglobin: 13.7 g/dL (ref 13.0–17.0)
MCHC: 33.3 g/dL (ref 30.0–36.0)
MCV: 88.5 fL (ref 78.0–100.0)
Platelets: 208 10*3/uL (ref 150.0–400.0)
RBC: 4.64 Mil/uL (ref 4.22–5.81)
RDW: 14.3 % (ref 11.5–15.5)
WBC: 5.2 10*3/uL (ref 4.0–10.5)

## 2023-07-31 LAB — LIPID PANEL
Cholesterol: 156 mg/dL (ref 0–200)
HDL: 49.2 mg/dL (ref >39.00)
LDL Cholesterol: 86 mg/dL (ref 0–99)
NonHDL: 106.65
Total CHOL/HDL Ratio: 3
Triglycerides: 101 mg/dL (ref 0.0–149.0)
VLDL: 20.2 mg/dL (ref 0.0–40.0)

## 2023-07-31 LAB — COMPREHENSIVE METABOLIC PANEL
ALT: 44 U/L (ref 0–53)
AST: 36 U/L (ref 0–37)
Albumin: 4.7 g/dL (ref 3.5–5.2)
Alkaline Phosphatase: 42 U/L (ref 39–117)
BUN: 16 mg/dL (ref 6–23)
CO2: 30 meq/L (ref 19–32)
Calcium: 9.6 mg/dL (ref 8.4–10.5)
Chloride: 100 meq/L (ref 96–112)
Creatinine, Ser: 1.14 mg/dL (ref 0.40–1.50)
GFR: 67.15 mL/min (ref >60.00)
Glucose, Bld: 110 mg/dL — ABNORMAL HIGH (ref 70–99)
Potassium: 3.9 meq/L (ref 3.5–5.1)
Sodium: 137 meq/L (ref 135–145)
Total Bilirubin: 1.1 mg/dL (ref 0.2–1.2)
Total Protein: 7.3 g/dL (ref 6.0–8.3)

## 2023-07-31 LAB — TSH: TSH: 2.11 u[IU]/mL (ref 0.35–5.50)

## 2023-07-31 LAB — HEMOGLOBIN A1C: Hgb A1c MFr Bld: 5.9 % (ref 4.6–6.5)

## 2023-07-31 LAB — PSA: PSA: 0.97 ng/mL (ref 0.10–4.00)

## 2023-07-31 MED ORDER — ATORVASTATIN CALCIUM 10 MG PO TABS: 10.0000 mg | ORAL_TABLET | Freq: Every day | ORAL | 3 refills | Status: AC

## 2023-07-31 MED ORDER — LISINOPRIL 40 MG PO TABS: 40.0000 mg | ORAL_TABLET | Freq: Every day | ORAL | 3 refills | Status: AC

## 2023-07-31 MED ORDER — TADALAFIL 20 MG PO TABS: 10.0000 mg | ORAL_TABLET | ORAL | 11 refills | Status: AC | PRN

## 2023-07-31 MED ORDER — HYDROCHLOROTHIAZIDE 25 MG PO TABS: 25.0000 mg | ORAL_TABLET | Freq: Every day | ORAL | 3 refills | Status: AC

## 2023-07-31 MED ORDER — PANTOPRAZOLE SODIUM 40 MG PO TBEC: 40.0000 mg | DELAYED_RELEASE_TABLET | Freq: Every day | ORAL | 3 refills | Status: AC

## 2023-07-31 NOTE — Patient Instructions (Signed)
It was great seeing you today   We will follow up with you regarding your lab work   Please let me know if you need anything   

## 2023-07-31 NOTE — Progress Notes (Signed)
Subjective:    Patient ID: Nonda Lou., male    DOB: 1955/10/26, 68 y.o.   MRN: [id number]  HPI Patient presents for yearly preventative medicine examination. He is a pleasant 68 year old male who  has a past medical history of Allergy, Cancer (HCC), GERD (gastroesophageal reflux disease), History of radiation therapy (12/15/17- 01/29/18), Hyperlipidemia, and Hypertension.  Hypertension -maintained on lisinopril 40 mg daily and HCTZ 25 mg daily.  He denies dizziness, lightheadedness, chest pain, shortness of breath.  He does monitor his blood pressure at home periodically reports readings in the 120s over 80s BP Readings from Last 3 Encounters:  07/31/23 110/80  05/23/23 110/64  07/04/22 120/80   GERD-controlled with Protonix 40 mg daily  Hyperlipidemia-managed with Lipitor 10 mg daily.  He denies myalgia or fatigue Lab Results  Component Value Date   CHOL 140 07/04/2022   HDL 50.80 07/04/2022   LDLCALC 68 07/04/2022   TRIG 106.0 07/04/2022   CHOLHDL 3 07/04/2022   Pre Diabetes - not currently on medication Lab Results  Component Value Date   HGBA1C 5.8 07/04/2022   HGBA1C 6.0 06/27/2021   HGBA1C 5.8 (H) 06/21/2020   Erectile Dysfunction - he has trouble maintaining and getting an erection. This has been a chronic issue   All immunizations and health maintenance protocols were reviewed with the patient and needed orders were placed.  Appropriate screening laboratory values were ordered for the patient including screening of hyperlipidemia, renal function and hepatic function. If indicated by BPH, a PSA was ordered.  Medication reconciliation,  past medical history, social history, problem list and allergies were reviewed in detail with the patient  Goals were established with regard to weight loss, exercise, and  diet in compliance with medications Wt Readings from Last 3 Encounters:  07/31/23 221 lb (100.2 kg)  05/23/23 220 lb 6.4 oz (100 kg)  07/04/22 219  lb (99.3 kg)   He is up to date on routine colon cancer screening   He has no acute complaints.   Review of Systems  Constitutional: Negative.   HENT: Negative.    Eyes: Negative.   Respiratory: Negative.    Cardiovascular: Negative.   Gastrointestinal: Negative.   Endocrine: Negative.   Genitourinary: Negative.   Musculoskeletal: Negative.   Skin: Negative.   Allergic/Immunologic: Negative.   Neurological: Negative.   Hematological: Negative.   Psychiatric/Behavioral: Negative.    All other systems reviewed and are negative.  Past Medical History:  Diagnosis Date   Allergy    pollen   Cancer [facility])    Parotid cancer-Left   GERD (gastroesophageal reflux disease)    History of radiation therapy 12/15/17- 01/29/18   Left Parotid and Left Neck 66 Gy total for 33 fractions with lower doses to at risk surrounding tissues.    Hyperlipidemia    Hypertension     Social History   Socioeconomic History   Marital status: Single    Spouse name: Not on file   Number of children: Not on file   Years of education: Not on file   Highest education level: Not on file  Occupational History   Not on file  Tobacco Use   Smoking status: Never   Smokeless tobacco: Never  Vaping Use   Vaping status: Never Used  Substance and Sexual Activity   Alcohol use: Yes    Comment: occasionally   Drug use: No   Sexual activity: Not Currently  Other Topics Concern   Not on file  Social History Narrative   Not on file   Social Drivers of Health   Financial Resource Strain: Not on file  Food Insecurity: Not on file  Transportation Needs: No Transportation Needs (05/08/2018)   PRAPARE - Administrator, Civil Service (Medical): No    Lack of Transportation (Non-Medical): No  Physical Activity: Not on file  Stress: Not on file  Social Connections: Not on file  Intimate Partner Violence: Not At Risk (02/20/2018)   Humiliation, Afraid, Rape, and Kick questionnaire    Fear of  Current or Ex-Partner: No    Emotionally Abused: No    Physically Abused: No    Sexually Abused: No    Past Surgical History:  Procedure Laterality Date   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     COLONOSCOPY  01/01/2021   LEG SURGERY Left    surgery for a broken leg when he was 68 years old    MULTIPLE EXTRACTIONS WITH ALVEOLOPLASTY N/A 11/20/2017   Procedure: Extaction of tooth #'s 14 and 15 with alveoloplasty and gross debridement of remaining teeth;  Surgeon: Charlynne Pander, DDS;  Location: [facility] ORS;  Service: Oral Surgery;  Laterality: N/A;   PAROTIDECTOMY Left 11/06/2017   Procedure: PAROTIDECTOMY;  Surgeon: Serena Colonel, MD;  Location: [facility] OR;  Service: ENT;  Laterality: Left;  Possible skin graft, possible nerve graft Needs RNFA   RADICAL NECK DISSECTION Left 11/06/2017   Procedure: RADICAL NECK DISSECTION;  Surgeon: Serena Colonel, MD;  Location: [facility] OR;  Service: ENT;  Laterality: Left;   SKIN SPLIT GRAFT Left 11/06/2017   Procedure: SKIN GRAFT SPLIT THICKNESS;  Surgeon: Serena Colonel, MD;  Location: [facility] OR;  Service: ENT;  Laterality: Left;   WISDOM TOOTH EXTRACTION      Family History  Problem Relation Age of Onset   Arthritis Mother    Hypertension Mother    Diabetes Father    Diabetes Brother    Colon cancer Neg Hx    Colon polyps Neg Hx    Esophageal cancer Neg Hx    Rectal cancer Neg Hx    Stomach cancer Neg Hx     Allergies  Allergen Reactions   Bee Pollen     Caused sinus infection   Pollen Extract     Caused sinus infection    Current Outpatient Medications on File Prior to Visit  Medication Sig Dispense Refill   acetaminophen (TYLENOL) 325 MG tablet Take 650 mg by mouth every 6 (six) hours as needed.     atorvastatin (LIPITOR) 10 MG tablet Take 1 tablet (10 mg total) by mouth at bedtime. 90 tablet 3   cholecalciferol (VITAMIN D) 1000 units tablet Take 1,000 Units by mouth daily.     hydrochlorothiazide (HYDRODIURIL) 25 MG tablet Take 1 tablet  (25 mg total) by mouth daily. 90 tablet 3   lisinopril (ZESTRIL) 40 MG tablet Take 1 tablet (40 mg total) by mouth daily. 90 tablet 3   Magnesium 400 MG TABS Take by mouth.     meclizine (ANTIVERT) 25 MG tablet Take 1 tablet (25 mg total) by mouth 3 (three) times daily as needed for dizziness. 30 tablet 0   Multiple Vitamins-Minerals (MULTIVITAMIN WITH MINERALS) tablet Take 1 tablet by mouth daily.     Nutritional Supplements (CHOLESTEROL DEFENSE PO) Take by mouth.     Oyster Shell Calcium 500 MG TABS Take by mouth.     pantoprazole (PROTONIX) 40 MG tablet Take  1 tablet (40 mg total) by mouth at bedtime. 90 tablet 3   vitamin E 400 UNIT capsule Take 400 Units by mouth daily.     No current facility-administered medications on file prior to visit.    BP 110/80   Pulse 67   Temp 98.3 F (36.8 C) (Oral)   Ht 5\' 7"  (1.702 m)   Wt 221 lb (100.2 kg)   SpO2 98%   PF (!) 1 L/min   BMI 34.61 kg/m       Objective:   Physical Exam Vitals and nursing note reviewed.  Constitutional:      General: He is not in acute distress.    Appearance: Normal appearance. He is not ill-appearing.  HENT:     Head: Normocephalic and atraumatic.     Right Ear: Tympanic membrane, ear canal and external ear normal. There is no impacted cerumen.     Left Ear: Tympanic membrane, ear canal and external ear normal. There is no impacted cerumen.     Nose: Nose normal. No congestion or rhinorrhea.     Mouth/Throat:     Mouth: Mucous membranes are moist.     Pharynx: Oropharynx is clear.  Eyes:     Extraocular Movements: Extraocular movements intact.     Conjunctiva/sclera: Conjunctivae normal.     Pupils: Pupils are equal, round, and reactive to light.  Neck:     Vascular: No carotid bruit.  Cardiovascular:     Rate and Rhythm: Normal rate and regular rhythm.     Pulses: Normal pulses.     Heart sounds: No murmur heard.    No friction rub. No gallop.  Pulmonary:     Effort: Pulmonary effort is normal.      Breath sounds: Normal breath sounds.  Abdominal:     General: Abdomen is flat. Bowel sounds are normal. There is no distension.     Palpations: Abdomen is soft. There is no mass.     Tenderness: There is no abdominal tenderness. There is no guarding or rebound.     Hernia: No hernia is present.  Musculoskeletal:        General: Normal range of motion.     Cervical back: Normal range of motion and neck supple.  Lymphadenopathy:     Cervical: No cervical adenopathy.  Skin:    General: Skin is warm and dry.     Capillary Refill: Capillary refill takes less than 2 seconds.  Neurological:     General: No focal deficit present.     Mental Status: He is alert and oriented to person, place, and time.  Psychiatric:        Mood and Affect: Mood normal.        Behavior: Behavior normal.        Thought Content: Thought content normal.        Judgment: Judgment normal.        Assessment & Plan:  1. Routine general medical examination at a health care facility (Primary) Today patient counseled on age appropriate routine health concerns for screening and prevention, each reviewed and up to date or declined. Immunizations reviewed and up to date or declined. Labs ordered and reviewed. Risk factors for depression reviewed and negative. Hearing function and visual acuity are intact. ADLs screened and addressed as needed. Functional ability and level of safety reviewed and appropriate. Education, counseling and referrals performed based on assessed risks today. Patient provided with a copy of personalized plan for preventive services. -  Follow up in one year or sooner if needed - Work on weight loss through diet and exercis  2. Essential hypertension - Well controlled. No change in medication - Lipid panel; Future - TSH; Future - CBC; Future - Comprehensive metabolic panel; Future - hydrochlorothiazide (HYDRODIURIL) 25 MG tablet; Take 1 tablet (25 mg total) by mouth daily.  Dispense: 90  tablet; Refill: 3 - lisinopril (ZESTRIL) 40 MG tablet; Take 1 tablet (40 mg total) by mouth daily.  Dispense: 90 tablet; Refill: 3  3. Gastroesophageal reflux disease with esophagitis without hemorrhage - Continue PPI  - Lipid panel; Future - TSH; Future - CBC; Future - Comprehensive metabolic panel; Future - pantoprazole (PROTONIX) 40 MG tablet; Take 1 tablet (40 mg total) by mouth at bedtime.  Dispense: 90 tablet; Refill: 3  4. Mixed hyperlipidemia - Continue with lipitor 10 mg daily  - Lipid panel; Future - TSH; Future - CBC; Future - Comprehensive metabolic panel; Future - atorvastatin (LIPITOR) 10 MG tablet; Take 1 tablet (10 mg total) by mouth at bedtime.  Dispense: 90 tablet; Refill: 3  5. Prediabetes - Consider adding metformin  - Lipid panel; Future - TSH; Future - CBC; Future - Comprehensive metabolic panel; Future - Hemoglobin A1c; Future  6. Prostate cancer screening  - PSA; Future  7. Erectile dysfunction, unspecified erectile dysfunction type  - tadalafil (CIALIS) 20 MG tablet; Take 0.5-1 tablets (10-20 mg total) by mouth every other day as needed for erectile dysfunction.  Dispense: 10 tablet; Refill: 11  Shirline Frees, NP

## 2023-08-08 DIAGNOSIS — J0100 Acute maxillary sinusitis, unspecified: Secondary | ICD-10-CM | POA: Diagnosis not present

## 2023-08-08 DIAGNOSIS — J00 Acute nasopharyngitis [common cold]: Secondary | ICD-10-CM | POA: Diagnosis not present

## 2023-08-11 ENCOUNTER — Ambulatory Visit: Payer: BC Managed Care – PPO | Admitting: Family Medicine

## 2024-01-15 DIAGNOSIS — H2513 Age-related nuclear cataract, bilateral: Secondary | ICD-10-CM | POA: Diagnosis not present

## 2024-01-15 DIAGNOSIS — H02403 Unspecified ptosis of bilateral eyelids: Secondary | ICD-10-CM | POA: Diagnosis not present

## 2024-01-15 DIAGNOSIS — H40013 Open angle with borderline findings, low risk, bilateral: Secondary | ICD-10-CM | POA: Diagnosis not present

## 2024-01-15 DIAGNOSIS — H25013 Cortical age-related cataract, bilateral: Secondary | ICD-10-CM | POA: Diagnosis not present

## 2024-04-16 DIAGNOSIS — Z23 Encounter for immunization: Secondary | ICD-10-CM | POA: Diagnosis not present

## 2024-04-23 DIAGNOSIS — Z23 Encounter for immunization: Secondary | ICD-10-CM | POA: Diagnosis not present

## 2024-06-18 ENCOUNTER — Telehealth: Payer: Self-pay

## 2024-06-18 NOTE — Telephone Encounter (Signed)
 Attempted to reach patient concerning colonoscopy recall; unable to speak with patient;  left message and number to the office for patient to call back and schedule appts;

## 2024-06-30 NOTE — Telephone Encounter (Signed)
 Attempted to reach patient concerning colonoscopy recall; unable to speak with patient;  left message and number to the office for patient to call back and schedule appts;
